# Patient Record
Sex: Female | Born: 1955 | Race: Black or African American | Hispanic: No | Marital: Single | Smoking: Never smoker
Health system: Southern US, Community
[De-identification: ages and names within clinical notes are randomized; demographics above are authoritative.]

## PROBLEM LIST (undated history)

## (undated) DIAGNOSIS — M86172 Other acute osteomyelitis, left ankle and foot: Secondary | ICD-10-CM

## (undated) DIAGNOSIS — E119 Type 2 diabetes mellitus without complications: Secondary | ICD-10-CM

## (undated) DIAGNOSIS — J45909 Unspecified asthma, uncomplicated: Secondary | ICD-10-CM

## (undated) DIAGNOSIS — I10 Essential (primary) hypertension: Secondary | ICD-10-CM

## (undated) DIAGNOSIS — F32A Depression, unspecified: Secondary | ICD-10-CM

## (undated) DIAGNOSIS — R6520 Severe sepsis without septic shock: Secondary | ICD-10-CM

## (undated) DIAGNOSIS — G5600 Carpal tunnel syndrome, unspecified upper limb: Secondary | ICD-10-CM

## (undated) DIAGNOSIS — K219 Gastro-esophageal reflux disease without esophagitis: Secondary | ICD-10-CM

## (undated) DIAGNOSIS — D869 Sarcoidosis, unspecified: Secondary | ICD-10-CM

## (undated) DIAGNOSIS — J9621 Acute and chronic respiratory failure with hypoxia: Secondary | ICD-10-CM

## (undated) DIAGNOSIS — F329 Major depressive disorder, single episode, unspecified: Secondary | ICD-10-CM

## (undated) DIAGNOSIS — F1910 Other psychoactive substance abuse, uncomplicated: Secondary | ICD-10-CM

## (undated) DIAGNOSIS — A419 Sepsis, unspecified organism: Secondary | ICD-10-CM

## (undated) DIAGNOSIS — G5620 Lesion of ulnar nerve, unspecified upper limb: Secondary | ICD-10-CM

## (undated) HISTORY — PX: APPENDECTOMY: SHX54

## (undated) HISTORY — PX: BACK SURGERY: SHX140

## (undated) HISTORY — PX: OTHER SURGICAL HISTORY: SHX169

## (undated) HISTORY — PX: FOOT AMPUTATION THROUGH METATARSAL: SHX644

---

## 2018-05-08 ENCOUNTER — Inpatient Hospital Stay
Admit: 2018-05-08 | Discharge: 2018-05-29 | Disposition: A | Discharge status: Other Acute Inpatient Hospital | Payer: Medicare HMO | Source: Other Acute Inpatient Hospital | Attending: Internal Medicine | Admitting: Internal Medicine

## 2018-05-08 DIAGNOSIS — R52 Pain, unspecified: Secondary | ICD-10-CM

## 2018-05-08 DIAGNOSIS — J9690 Respiratory failure, unspecified, unspecified whether with hypoxia or hypercapnia: Secondary | ICD-10-CM

## 2018-05-08 DIAGNOSIS — E1142 Type 2 diabetes mellitus with diabetic polyneuropathy: Secondary | ICD-10-CM

## 2018-05-08 DIAGNOSIS — M86271 Subacute osteomyelitis, right ankle and foot: Secondary | ICD-10-CM

## 2018-05-09 LAB — BASIC METABOLIC PANEL
Anion gap: 7 (ref 5–15)
BUN: 18 mg/dL (ref 8–23)
CO2: 21 mmol/L — ABNORMAL LOW (ref 22–32)
Calcium: 7.4 mg/dL — ABNORMAL LOW (ref 8.9–10.3)
Chloride: 104 mmol/L (ref 98–111)
Creatinine, Ser: 0.91 mg/dL (ref 0.44–1.00)
GFR calc Af Amer: >60 mL/min (ref >60)
GFR calc non Af Amer: >60 mL/min (ref >60)
Glucose, Bld: 257 mg/dL — ABNORMAL HIGH (ref 70–99)
Potassium: 3.7 mmol/L (ref 3.5–5.1)
Sodium: 132 mmol/L — ABNORMAL LOW (ref 135–145)

## 2018-05-09 LAB — CBC
HCT: 23.6 % — ABNORMAL LOW (ref 36.0–46.0)
Hemoglobin: 7.6 g/dL — ABNORMAL LOW (ref 12.0–15.0)
MCH: 24.6 pg — ABNORMAL LOW (ref 26.0–34.0)
MCHC: 32.2 g/dL (ref 30.0–36.0)
MCV: 76.4 fL — ABNORMAL LOW (ref 80.0–100.0)
Platelets: 282 10*3/uL (ref 150–400)
RBC: 3.09 MIL/uL — ABNORMAL LOW (ref 3.87–5.11)
RDW: 18.2 % — ABNORMAL HIGH (ref 11.5–15.5)
WBC: 13.4 10*3/uL — ABNORMAL HIGH (ref 4.0–10.5)
nRBC: 0 % (ref 0.0–0.2)

## 2018-05-10 ENCOUNTER — Encounter (HOSPITAL_BASED_OUTPATIENT_CLINIC_OR_DEPARTMENT_OTHER): Payer: Medicare HMO

## 2018-05-10 DIAGNOSIS — M7989 Other specified soft tissue disorders: Secondary | ICD-10-CM

## 2018-05-10 NOTE — Progress Notes (Signed)
VASCULAR LAB PRELIMINARY  PRELIMINARY  PRELIMINARY  PRELIMINARY  Left upper extremity venous duplex completed.    Preliminary report:  See CV proc for preliminary results.  Lima Chillemi, RVT 05/10/2018, 6:42 PM

## 2018-05-11 LAB — BASIC METABOLIC PANEL
Anion gap: 8 (ref 5–15)
BUN: 11 mg/dL (ref 8–23)
CO2: 24 mmol/L (ref 22–32)
Calcium: 7.7 mg/dL — ABNORMAL LOW (ref 8.9–10.3)
Chloride: 99 mmol/L (ref 98–111)
Creatinine, Ser: 0.76 mg/dL (ref 0.44–1.00)
GFR calc Af Amer: >60 mL/min (ref >60)
GFR calc non Af Amer: >60 mL/min (ref >60)
Glucose, Bld: 151 mg/dL — ABNORMAL HIGH (ref 70–99)
Potassium: 3.7 mmol/L (ref 3.5–5.1)
Sodium: 131 mmol/L — ABNORMAL LOW (ref 135–145)

## 2018-05-11 LAB — CBC
HCT: 22.9 % — ABNORMAL LOW (ref 36.0–46.0)
Hemoglobin: 7.4 g/dL — ABNORMAL LOW (ref 12.0–15.0)
MCH: 24.7 pg — ABNORMAL LOW (ref 26.0–34.0)
MCHC: 32.3 g/dL (ref 30.0–36.0)
MCV: 76.6 fL — ABNORMAL LOW (ref 80.0–100.0)
Platelets: 343 10*3/uL (ref 150–400)
RBC: 2.99 MIL/uL — ABNORMAL LOW (ref 3.87–5.11)
RDW: 18.4 % — ABNORMAL HIGH (ref 11.5–15.5)
WBC: 14.7 10*3/uL — ABNORMAL HIGH (ref 4.0–10.5)
nRBC: 0 % (ref 0.0–0.2)

## 2018-05-12 LAB — CREATININE, SERUM
Creatinine, Ser: 0.76 mg/dL (ref 0.44–1.00)
GFR calc Af Amer: >60 mL/min (ref >60)
GFR calc non Af Amer: >60 mL/min (ref >60)

## 2018-05-12 LAB — BUN: BUN: 14 mg/dL (ref 8–23)

## 2018-05-12 LAB — CK: Total CK: 18 U/L — ABNORMAL LOW (ref 38–234)

## 2018-05-16 LAB — BLOOD CULTURE ID PANEL (REFLEXED)

## 2018-05-17 LAB — BASIC METABOLIC PANEL
Anion gap: 10 (ref 5–15)
BUN: 20 mg/dL (ref 8–23)
CO2: 27 mmol/L (ref 22–32)
Calcium: 8.3 mg/dL — ABNORMAL LOW (ref 8.9–10.3)
Chloride: 93 mmol/L — ABNORMAL LOW (ref 98–111)
Creatinine, Ser: 0.78 mg/dL (ref 0.44–1.00)
GFR calc Af Amer: >60 mL/min (ref >60)
GFR calc non Af Amer: >60 mL/min (ref >60)
Glucose, Bld: 195 mg/dL — ABNORMAL HIGH (ref 70–99)
Potassium: 4.1 mmol/L (ref 3.5–5.1)
Sodium: 130 mmol/L — ABNORMAL LOW (ref 135–145)

## 2018-05-18 LAB — CULTURE, BLOOD (ROUTINE X 2)
Special Requests: ADEQUATE
Special Requests: ADEQUATE

## 2018-05-19 LAB — CULTURE, BLOOD (ROUTINE X 2)
Special Requests: ADEQUATE
Special Requests: ADEQUATE

## 2018-05-20 ENCOUNTER — Other Ambulatory Visit (HOSPITAL_COMMUNITY): Payer: Medicare HMO

## 2018-05-20 LAB — BASIC METABOLIC PANEL
Anion gap: 8 (ref 5–15)
Anion gap: 8 (ref 5–15)
BUN: 16 mg/dL (ref 8–23)
BUN: 19 mg/dL (ref 8–23)
CO2: 29 mmol/L (ref 22–32)
CO2: 28 mmol/L (ref 22–32)
Calcium: 8.2 mg/dL — ABNORMAL LOW (ref 8.9–10.3)
Calcium: 8.3 mg/dL — ABNORMAL LOW (ref 8.9–10.3)
Chloride: 97 mmol/L — ABNORMAL LOW (ref 98–111)
Chloride: 97 mmol/L — ABNORMAL LOW (ref 98–111)
Creatinine, Ser: 0.76 mg/dL (ref 0.44–1.00)
Creatinine, Ser: 0.8 mg/dL (ref 0.44–1.00)
GFR calc Af Amer: >60 mL/min (ref >60)
GFR calc Af Amer: >60 mL/min (ref >60)
GFR calc non Af Amer: >60 mL/min (ref >60)
GFR calc non Af Amer: >60 mL/min (ref >60)
Glucose, Bld: 112 mg/dL — ABNORMAL HIGH (ref 70–99)
Glucose, Bld: 80 mg/dL (ref 70–99)
Potassium: 3.5 mmol/L (ref 3.5–5.1)
Potassium: 3.6 mmol/L (ref 3.5–5.1)
Sodium: 134 mmol/L — ABNORMAL LOW (ref 135–145)
Sodium: 133 mmol/L — ABNORMAL LOW (ref 135–145)

## 2018-05-20 LAB — CBC WITH DIFFERENTIAL/PLATELET
Abs Immature Granulocytes: 0 10*3/uL (ref 0.00–0.07)
Basophils Absolute: 0 10*3/uL (ref 0.0–0.1)
Basophils Relative: 0 %
Eosinophils Absolute: 0 10*3/uL (ref 0.0–0.5)
Eosinophils Relative: 0 %
HCT: 18.3 % — ABNORMAL LOW (ref 36.0–46.0)
Hemoglobin: 5.8 g/dL — CL (ref 12.0–15.0)
Lymphocytes Relative: 23 %
Lymphs Abs: 2.2 10*3/uL (ref 0.7–4.0)
MCH: 24.8 pg — ABNORMAL LOW (ref 26.0–34.0)
MCHC: 31.7 g/dL (ref 30.0–36.0)
MCV: 78.2 fL — ABNORMAL LOW (ref 80.0–100.0)
Monocytes Absolute: 0.4 10*3/uL (ref 0.1–1.0)
Monocytes Relative: 4 %
Neutro Abs: 7 10*3/uL (ref 1.7–7.7)
Neutrophils Relative %: 73 %
Platelets: 298 10*3/uL (ref 150–400)
RBC: 2.34 MIL/uL — ABNORMAL LOW (ref 3.87–5.11)
RDW: 18.3 % — ABNORMAL HIGH (ref 11.5–15.5)
WBC: 9.6 10*3/uL (ref 4.0–10.5)
nRBC: 0 % (ref 0.0–0.2)
nRBC: 1 /100 WBC — ABNORMAL HIGH

## 2018-05-20 LAB — CBC
HCT: 20.6 % — ABNORMAL LOW (ref 36.0–46.0)
HCT: 22 % — ABNORMAL LOW (ref 36.0–46.0)
Hemoglobin: 6.5 g/dL — CL (ref 12.0–15.0)
Hemoglobin: 7.1 g/dL — ABNORMAL LOW (ref 12.0–15.0)
MCH: 25.5 pg — ABNORMAL LOW (ref 26.0–34.0)
MCH: 26 pg (ref 26.0–34.0)
MCHC: 31.6 g/dL (ref 30.0–36.0)
MCHC: 32.3 g/dL (ref 30.0–36.0)
MCV: 80.8 fL (ref 80.0–100.0)
MCV: 80.6 fL (ref 80.0–100.0)
Platelets: 284 10*3/uL (ref 150–400)
Platelets: 297 10*3/uL (ref 150–400)
RBC: 2.55 MIL/uL — ABNORMAL LOW (ref 3.87–5.11)
RBC: 2.73 MIL/uL — ABNORMAL LOW (ref 3.87–5.11)
RDW: 19.6 % — ABNORMAL HIGH (ref 11.5–15.5)
RDW: 18.9 % — ABNORMAL HIGH (ref 11.5–15.5)
WBC: 10.4 10*3/uL (ref 4.0–10.5)
WBC: 12.3 10*3/uL — ABNORMAL HIGH (ref 4.0–10.5)
nRBC: 0 % (ref 0.0–0.2)
nRBC: 0 % (ref 0.0–0.2)

## 2018-05-20 LAB — PREPARE RBC (CROSSMATCH)

## 2018-05-20 LAB — ABO/RH: ABO/RH(D): O POS

## 2018-05-21 LAB — CBC
HCT: 21.3 % — ABNORMAL LOW (ref 36.0–46.0)
HCT: 23.8 % — ABNORMAL LOW (ref 36.0–46.0)
Hemoglobin: 6.8 g/dL — CL (ref 12.0–15.0)
Hemoglobin: 7.6 g/dL — ABNORMAL LOW (ref 12.0–15.0)
MCH: 25.9 pg — ABNORMAL LOW (ref 26.0–34.0)
MCH: 25.8 pg — ABNORMAL LOW (ref 26.0–34.0)
MCHC: 31.9 g/dL (ref 30.0–36.0)
MCHC: 31.9 g/dL (ref 30.0–36.0)
MCV: 81 fL (ref 80.0–100.0)
MCV: 80.7 fL (ref 80.0–100.0)
Platelets: 300 10*3/uL (ref 150–400)
Platelets: 318 10*3/uL (ref 150–400)
RBC: 2.63 MIL/uL — ABNORMAL LOW (ref 3.87–5.11)
RBC: 2.95 MIL/uL — ABNORMAL LOW (ref 3.87–5.11)
RDW: 18.6 % — ABNORMAL HIGH (ref 11.5–15.5)
RDW: 18.6 % — ABNORMAL HIGH (ref 11.5–15.5)
WBC: 10.9 10*3/uL — ABNORMAL HIGH (ref 4.0–10.5)
WBC: 12.4 10*3/uL — ABNORMAL HIGH (ref 4.0–10.5)
nRBC: 0 % (ref 0.0–0.2)
nRBC: 0 % (ref 0.0–0.2)

## 2018-05-21 LAB — OCCULT BLOOD X 1 CARD TO LAB, STOOL: Fecal Occult Bld: POSITIVE — AB

## 2018-05-21 LAB — PREPARE RBC (CROSSMATCH)

## 2018-05-22 LAB — BPAM RBC
Blood Product Expiration Date: 202005042359
Blood Product Expiration Date: 202005052359
ISSUE DATE / TIME: 202004291614
ISSUE DATE / TIME: 202004301315
Unit Type and Rh: 5100
Unit Type and Rh: 5100

## 2018-05-22 LAB — CBC
HCT: 22.4 % — ABNORMAL LOW (ref 36.0–46.0)
Hemoglobin: 7.4 g/dL — ABNORMAL LOW (ref 12.0–15.0)
MCH: 26.6 pg (ref 26.0–34.0)
MCHC: 33 g/dL (ref 30.0–36.0)
MCV: 80.6 fL (ref 80.0–100.0)
Platelets: 274 10*3/uL (ref 150–400)
RBC: 2.78 MIL/uL — ABNORMAL LOW (ref 3.87–5.11)
RDW: 17.9 % — ABNORMAL HIGH (ref 11.5–15.5)
WBC: 11.6 10*3/uL — ABNORMAL HIGH (ref 4.0–10.5)
nRBC: 0 % (ref 0.0–0.2)

## 2018-05-22 LAB — TYPE AND SCREEN
ABO/RH(D): O POS
Antibody Screen: NEGATIVE
Unit division: 0
Unit division: 0

## 2018-05-23 LAB — CULTURE, BLOOD (ROUTINE X 2)
Special Requests: ADEQUATE
Special Requests: ADEQUATE

## 2018-05-24 LAB — URINALYSIS, MICROSCOPIC (REFLEX)
Bacteria, UA: NONE SEEN
RBC / HPF: NONE SEEN RBC/hpf (ref 0–5)
Squamous Epithelial / HPF: NONE SEEN (ref 0–5)

## 2018-05-24 LAB — URINALYSIS, ROUTINE W REFLEX MICROSCOPIC
Glucose, UA: NEGATIVE mg/dL
Ketones, ur: NEGATIVE mg/dL
Nitrite: NEGATIVE
Protein, ur: 30 mg/dL — AB
Specific Gravity, Urine: 1.01 (ref 1.005–1.030)
pH: 6.5 (ref 5.0–8.0)

## 2018-05-24 LAB — VANCOMYCIN, TROUGH: Vancomycin Tr: 14 ug/mL — ABNORMAL LOW (ref 15–20)

## 2018-05-25 ENCOUNTER — Other Ambulatory Visit (HOSPITAL_COMMUNITY): Payer: Medicare HMO

## 2018-05-25 LAB — BASIC METABOLIC PANEL
Anion gap: 6 (ref 5–15)
BUN: 11 mg/dL (ref 8–23)
CO2: 27 mmol/L (ref 22–32)
Calcium: 8 mg/dL — ABNORMAL LOW (ref 8.9–10.3)
Chloride: 100 mmol/L (ref 98–111)
Creatinine, Ser: 0.7 mg/dL (ref 0.44–1.00)
GFR calc Af Amer: >60 mL/min (ref >60)
GFR calc non Af Amer: >60 mL/min (ref >60)
Glucose, Bld: 107 mg/dL — ABNORMAL HIGH (ref 70–99)
Potassium: 3.2 mmol/L — ABNORMAL LOW (ref 3.5–5.1)
Sodium: 133 mmol/L — ABNORMAL LOW (ref 135–145)

## 2018-05-25 LAB — CBC
HCT: 24.8 % — ABNORMAL LOW (ref 36.0–46.0)
Hemoglobin: 7.9 g/dL — ABNORMAL LOW (ref 12.0–15.0)
MCH: 25.8 pg — ABNORMAL LOW (ref 26.0–34.0)
MCHC: 31.9 g/dL (ref 30.0–36.0)
MCV: 81 fL (ref 80.0–100.0)
Platelets: 372 10*3/uL (ref 150–400)
RBC: 3.06 MIL/uL — ABNORMAL LOW (ref 3.87–5.11)
RDW: 18.6 % — ABNORMAL HIGH (ref 11.5–15.5)
WBC: 11.6 10*3/uL — ABNORMAL HIGH (ref 4.0–10.5)
nRBC: 0 % (ref 0.0–0.2)

## 2018-05-25 LAB — URINE CULTURE: Culture: NO GROWTH

## 2018-05-25 NOTE — Progress Notes (Signed)
  Echocardiogram 2D Echocardiogram has been performed.  Delcie Roch 05/25/2018, 9:03 AM

## 2018-05-26 ENCOUNTER — Other Ambulatory Visit (HOSPITAL_COMMUNITY): Payer: Medicare HMO

## 2018-05-26 LAB — VANCOMYCIN, TROUGH: Vancomycin Tr: 23 ug/mL (ref 15–20)

## 2018-05-26 LAB — POTASSIUM: Potassium: 3.5 mmol/L (ref 3.5–5.1)

## 2018-05-26 MED ORDER — IOHEXOL 300 MG/ML  SOLN
100.0000 mL | Freq: Once | INTRAMUSCULAR | Status: AC | PRN
  Administered 2018-05-26: 17:00:00 100 mL via INTRAVENOUS

## 2018-05-27 DIAGNOSIS — E1142 Type 2 diabetes mellitus with diabetic polyneuropathy: Secondary | ICD-10-CM

## 2018-05-27 DIAGNOSIS — M86271 Subacute osteomyelitis, right ankle and foot: Secondary | ICD-10-CM

## 2018-05-27 LAB — VANCOMYCIN, TROUGH: Vancomycin Tr: 9 ug/mL — ABNORMAL LOW (ref 15–20)

## 2018-05-27 NOTE — H&P (View-Only) (Signed)
ORTHOPAEDIC CONSULTATION  REQUESTING PHYSICIAN: Carron CurieHijazi, Ali, MD  Chief Complaint: Osteomyelitis and cellulitis right transmetatarsal amputation.  HPI: Rachel Proctor is a 63 y.o. female who presents with multiple medical problems including bilateral transmetatarsal amputations who has been having problems with ulceration drainage cellulitis and osteomyelitis of her right transmetatarsal amputation.  No past medical history on file.     Past medical history is significant for hypothyroidism protein caloric malnutrition renal insufficiency metabolic encephalopathy status post bilateral transmetatarsal amputations with history of polysubstance abuse and diabetes. Social History   Socioeconomic History   Marital status: Single    Spouse name: Not on file   Number of children: Not on file   Years of education: Not on file   Highest education level: Not on file  Occupational History   Not on file  Social Needs   Financial resource strain: Not on file   Food insecurity:    Worry: Not on file    Inability: Not on file   Transportation needs:    Medical: Not on file    Non-medical: Not on file  Tobacco Use   Smoking status: Not on file  Substance and Sexual Activity   Alcohol use: Not on file   Drug use: Not on file   Sexual activity: Not on file  Lifestyle   Physical activity:    Days per week: Not on file    Minutes per session: Not on file   Stress: Not on file  Relationships   Social connections:    Talks on phone: Not on file    Gets together: Not on file    Attends religious service: Not on file    Active member of club or organization: Not on file    Attends meetings of clubs or organizations: Not on file    Relationship status: Not on file  Other Topics Concern   Not on file  Social History Narrative   Not on file   No family history on file. - negative except otherwise stated in the family history section Allergies not on file Prior to  Admission medications   Not on File   Ct Chest W Contrast  Result Date: 05/26/2018 CLINICAL DATA:  Actinomycosis.  Abscess. EXAM: CT CHEST WITH CONTRAST TECHNIQUE: Multidetector CT imaging of the chest was performed during intravenous contrast administration. CONTRAST:  100mL OMNIPAQUE IOHEXOL 300 MG/ML  SOLN COMPARISON:  None. FINDINGS: Cardiovascular: Atherosclerotic atherosclerotic calcification of the aortic arch and branch vessels. Mild cardiomegaly. Small pericardial effusion. Mediastinum/Nodes: Right lower paratracheal node anterior to the carina measures 1.0 cm in short axis on image 45/3. Indistinct right hilar node approximately 1.0 cm in short axis, image 54/3. Lungs/Pleura: Small bilateral pleural effusions with passive atelectasis. These effusions are nonspecific for exudative or transudative etiology. Bilateral scattered pulmonary nodules of variable size, some with internal cavitation, for the most part peripherally distributed in the lungs. A right upper lobe nodule with mild internal cavitation measures 1.6 by 1.5 cm on image 49/4. An index left upper lobe nodule also with a small amount of internal cavitation measures 1.2 by 1.3 cm on image 54/4. No findings of chest wall involvement/empyema necessitans. Upper Abdomen: Unremarkable Musculoskeletal: Old healed fracture of the left posterolateral fifth, sixth, seventh, and eighth ribs. Minimal thoracic spondylosis. IMPRESSION: 1. Pleural effusion with scattered bilateral pulmonary nodules some of which are mildly cavitary. This appearance, while nonspecific, would certainly be very reasonably consistent with active pulmonary actinomycosis. Other entities which can cause cavitating lung  nodules include tuberculosis, cavitating metastatic disease such as squamous cell carcinoma, noninfectious granuloma such as rheumatoid nodules, pulmonary infarcts, and other atypical infectious processes such as coccidiomycosis, nocardiosis, or cryptococcosis.  There are no findings of empyema necessitans. 2. Borderline enlarged right lower paratracheal and right hilar lymph nodes. 3. Aortic Atherosclerosis (ICD10-I70.0). Mild cardiomegaly with small pericardial effusion. Electronically Signed   By: Walter  Liebkemann M.D.   On: 05/26/2018 17:16  ° °Ct Foot Right W Contrast ° °Result Date: 05/26/2018 °CLINICAL DATA:  Osteomyelitis.  Actinomycosis. EXAM: CT OF THE LOWER RIGHT EXTREMITY WITH CONTRAST TECHNIQUE: Multidetector CT imaging of the lower right extremity was performed according to the standard protocol following intravenous contrast administration. COMPARISON:  None. CONTRAST:  100mL OMNIPAQUE IOHEXOL 300 MG/ML  SOLN FINDINGS: Bones/Joint/Cartilage There has been amputation of the toes at the mid to distal metatarsal level with a bridging bony bar of callus between the distal metatarsals as shown for example on images 13 through 36 of series 6. On images 34 through 43 of series 8, the soft tissues overlying this bony bar particularly in the vicinity of the second metatarsal are prominently thinned and there is lucency in the bony bar dorsally suspicious for erosion and potentially representing active osteomyelitis although not entirely specific. Plantar calcaneal spur noted. Moderate bony demineralization. Dorsal spurring of the navicular. There is some mild spurring dorsally of the distal medial cuneiform. Articular space narrowing between the cuneiforms noted. Ligaments Suboptimally assessed by CT. Muscles and Tendons Notable fusiform thickening of the Achilles tendon up to 1.6 cm anterior-posterior. The epicenter of this thickening is about 4.5 cm proximal to the insertion site. Possible thickening of the tibialis posterior tendon distally. Thickened medial band of the plantar fascia proximally, with edema signal extending from the medial band of the plantar fascia towards the plantar cutaneous surface for example on image 26/8. Severe regional muscular atrophy.  Soft tissues Scattered subcutaneous edema is present along the heel, medial ankle, and plantar to the distal margin of the metatarsals. No gas tracking in the soft tissues identified. No obvious subcutaneous abscess. IMPRESSION: 1. Prior amputation of the toes at the level of the mid to distal metatarsals with a transverse bony intermetatarsal bar in this vicinity. Soft tissues overlying the second metatarsal portion of this bony bar are extremely thin, and there is some lucency in the bone at this location concerning for the possibility of active osteomyelitis. Correlate with appearance of the skin in this area. 2. Prominent fusiform thickening of the Achilles tendon compatible with tendinopathy. 3. Plantar fasciitis. 4. Subcutaneous edema plantar to the intermetatarsal bony bar. 5. Marked muscular atrophy. 6. Possible tendinopathy of the distal tibialis posterior tendon, which appears somewhat thickened. Electronically Signed   By: Walter  Liebkemann M.D.   On: 05/26/2018 17:34  ° °- pertinent xrays, CT, MRI studies were reviewed and independently interpreted ° °Positive ROS: All other systems have been reviewed and were otherwise negative with the exception of those mentioned in the HPI and as above. ° °Physical Exam: °General: Alert, no acute distress °Psychiatric: Patient is competent for consent with normal mood and affect °Lymphatic: No axillary or cervical lymphadenopathy °Cardiovascular: No pedal edema °Respiratory: No cyanosis, no use of accessory musculature °GI: No organomegaly, abdomen is soft and non-tender ° ° ° °Images: ° °@ENCIMAGES@ ° °Labs: ° °Lab Results  °Component Value Date  ° REPTSTATUS PENDING 05/24/2018  ° CULT  05/24/2018  °  NO GROWTH 3 DAYS °Performed at [facility], [address] [place], [place] [address]    LABORGA METHICILLIN RESISTANT STAPHYLOCOCCUS AUREUS 05/20/2018    No results found for: ALBUMIN, PREALBUMIN, LABURIC  Neurologic: Patient does not have protective  sensation bilateral lower extremities.   MUSCULOSKELETAL:   Skin: Examination patient has multiple ulcers on the right transmetatarsal amputation.  She does have a palpable pulse her foot is warm.  Review of the CT scan shows bony bridge across the metatarsals with periosteal elevation at the second metatarsal consistent with osteomyelitis.  Examination the left foot she has no ulcers no cellulitis she has good soft tissue good warmth.  Assessment: Assessment: Diabetic insensate neuropathy with osteomyelitis ulceration and cellulitis of her right transmetatarsal amputation.  Plan: Plan: We will plan for BKA surgery on Friday.  Discussed with the patient that a revision of the transmetatarsal amputation could possibly heal but I doubt this would give her sufficient surface area for safe ambulation.  She underwent irrigation and debridement of the foot about 3 weeks ago and still has an infection.  I have recommended proceeding with a transtibial amputation to both the patient and her Rachel Proctor who is the power of attorney.  They both agree that they would like to proceed with the transtibial amputation.  We will plan for surgery on Friday [facility]  Thank you for the consult and the opportunity to see Ms. Grace Isaac, MD [facility] [phone] 3:10 PM

## 2018-05-27 NOTE — Consult Note (Signed)
ORTHOPAEDIC CONSULTATION  REQUESTING PHYSICIAN: Carron CurieHijazi, Ali, MD  Chief Complaint: Osteomyelitis and cellulitis right transmetatarsal amputation.  HPI: Rachel BlueJanice Proctor is a 63 y.o. female who presents with multiple medical problems including bilateral transmetatarsal amputations who has been having problems with ulceration drainage cellulitis and osteomyelitis of her right transmetatarsal amputation.  No past medical history on file.     Past medical history is significant for hypothyroidism protein caloric malnutrition renal insufficiency metabolic encephalopathy status post bilateral transmetatarsal amputations with history of polysubstance abuse and diabetes. Social History   Socioeconomic History   Marital status: Single    Spouse name: Not on file   Number of children: Not on file   Years of education: Not on file   Highest education level: Not on file  Occupational History   Not on file  Social Needs   Financial resource strain: Not on file   Food insecurity:    Worry: Not on file    Inability: Not on file   Transportation needs:    Medical: Not on file    Non-medical: Not on file  Tobacco Use   Smoking status: Not on file  Substance and Sexual Activity   Alcohol use: Not on file   Drug use: Not on file   Sexual activity: Not on file  Lifestyle   Physical activity:    Days per week: Not on file    Minutes per session: Not on file   Stress: Not on file  Relationships   Social connections:    Talks on phone: Not on file    Gets together: Not on file    Attends religious service: Not on file    Active member of club or organization: Not on file    Attends meetings of clubs or organizations: Not on file    Relationship status: Not on file  Other Topics Concern   Not on file  Social History Narrative   Not on file   No family history on file. - negative except otherwise stated in the family history section Allergies not on file Prior to  Admission medications   Not on File   Ct Chest W Contrast  Result Date: 05/26/2018 CLINICAL DATA:  Actinomycosis.  Abscess. EXAM: CT CHEST WITH CONTRAST TECHNIQUE: Multidetector CT imaging of the chest was performed during intravenous contrast administration. CONTRAST:  100mL OMNIPAQUE IOHEXOL 300 MG/ML  SOLN COMPARISON:  None. FINDINGS: Cardiovascular: Atherosclerotic atherosclerotic calcification of the aortic arch and branch vessels. Mild cardiomegaly. Small pericardial effusion. Mediastinum/Nodes: Right lower paratracheal node anterior to the carina measures 1.0 cm in short axis on image 45/3. Indistinct right hilar node approximately 1.0 cm in short axis, image 54/3. Lungs/Pleura: Small bilateral pleural effusions with passive atelectasis. These effusions are nonspecific for exudative or transudative etiology. Bilateral scattered pulmonary nodules of variable size, some with internal cavitation, for the most part peripherally distributed in the lungs. A right upper lobe nodule with mild internal cavitation measures 1.6 by 1.5 cm on image 49/4. An index left upper lobe nodule also with a small amount of internal cavitation measures 1.2 by 1.3 cm on image 54/4. No findings of chest wall involvement/empyema necessitans. Upper Abdomen: Unremarkable Musculoskeletal: Old healed fracture of the left posterolateral fifth, sixth, seventh, and eighth ribs. Minimal thoracic spondylosis. IMPRESSION: 1. Pleural effusion with scattered bilateral pulmonary nodules some of which are mildly cavitary. This appearance, while nonspecific, would certainly be very reasonably consistent with active pulmonary actinomycosis. Other entities which can cause cavitating lung  nodules include tuberculosis, cavitating metastatic disease such as squamous cell carcinoma, noninfectious granuloma such as rheumatoid nodules, pulmonary infarcts, and other atypical infectious processes such as coccidiomycosis, nocardiosis, or cryptococcosis.  There are no findings of empyema necessitans. 2. Borderline enlarged right lower paratracheal and right hilar lymph nodes. 3. Aortic Atherosclerosis (ICD10-I70.0). Mild cardiomegaly with small pericardial effusion. Electronically Signed   By: Gaylyn Rong M.D.   On: 05/26/2018 17:16   Ct Foot Right W Contrast  Result Date: 05/26/2018 CLINICAL DATA:  Osteomyelitis.  Actinomycosis. EXAM: CT OF THE LOWER RIGHT EXTREMITY WITH CONTRAST TECHNIQUE: Multidetector CT imaging of the lower right extremity was performed according to the standard protocol following intravenous contrast administration. COMPARISON:  None. CONTRAST:  OMNIPAQUE IOHEXOL 300 MG/ML  SOLN FINDINGS: Bones/Joint/Cartilage There has been amputation of the toes at the mid to distal metatarsal level with a bridging bony bar of callus between the distal metatarsals as shown for example on images 13 through 36 of series 6. On images 34 through 43 of series 8, the soft tissues overlying this bony bar particularly in the vicinity of the second metatarsal are prominently thinned and there is lucency in the bony bar dorsally suspicious for erosion and potentially representing active osteomyelitis although not entirely specific. Plantar calcaneal spur noted. Moderate bony demineralization. Dorsal spurring of the navicular. There is some mild spurring dorsally of the distal medial cuneiform. Articular space narrowing between the cuneiforms noted. Ligaments Suboptimally assessed by CT. Muscles and Tendons Notable fusiform thickening of the Achilles tendon up to 1.6 cm anterior-posterior. The epicenter of this thickening is about 4.5 cm proximal to the insertion site. Possible thickening of the tibialis posterior tendon distally. Thickened medial band of the plantar fascia proximally, with edema signal extending from the medial band of the plantar fascia towards the plantar cutaneous surface for example on image 26/8. Severe regional muscular atrophy.  Soft tissues Scattered subcutaneous edema is present along the heel, medial ankle, and plantar to the distal margin of the metatarsals. No gas tracking in the soft tissues identified. No obvious subcutaneous abscess. IMPRESSION: 1. Prior amputation of the toes at the level of the mid to distal metatarsals with a transverse bony intermetatarsal bar in this vicinity. Soft tissues overlying the second metatarsal portion of this bony bar are extremely thin, and there is some lucency in the bone at this location concerning for the possibility of active osteomyelitis. Correlate with appearance of the skin in this area. 2. Prominent fusiform thickening of the Achilles tendon compatible with tendinopathy. 3. Plantar fasciitis. 4. Subcutaneous edema plantar to the intermetatarsal bony bar. 5. Marked muscular atrophy. 6. Possible tendinopathy of the distal tibialis posterior tendon, which appears somewhat thickened. Electronically Signed   By: Gaylyn Rong M.D.   On: 05/26/2018 17:34   - pertinent xrays, CT, MRI studies were reviewed and independently interpreted  Positive ROS: All other systems have been reviewed and were otherwise negative with the exception of those mentioned in the HPI and as above.  Physical Exam: General: Alert, no acute distress Psychiatric: Patient is competent for consent with normal mood and affect Lymphatic: No axillary or cervical lymphadenopathy Cardiovascular: No pedal edema Respiratory: No cyanosis, no use of accessory musculature GI: No organomegaly, abdomen is soft and non-tender    Images:  @  Labs:  Lab Results  Component Value Date   REPTSTATUS PENDING 05/24/2018   CULT  05/24/2018    NO GROWTH 3 DAYS Performed at [facility], [address] [place], [place] [address]    LABORGA METHICILLIN RESISTANT STAPHYLOCOCCUS AUREUS 05/20/2018    No results found for: ALBUMIN, PREALBUMIN, LABURIC  Neurologic: Patient does not have protective  sensation bilateral lower extremities.   MUSCULOSKELETAL:   Skin: Examination patient has multiple ulcers on the right transmetatarsal amputation.  She does have a palpable pulse her foot is warm.  Review of the CT scan shows bony bridge across the metatarsals with periosteal elevation at the second metatarsal consistent with osteomyelitis.  Examination the left foot she has no ulcers no cellulitis she has good soft tissue good warmth.  Assessment: Assessment: Diabetic insensate neuropathy with osteomyelitis ulceration and cellulitis of her right transmetatarsal amputation.  Plan: Plan: We will plan for BKA surgery on Friday.  Discussed with the patient that a revision of the transmetatarsal amputation could possibly heal but I doubt this would give her sufficient surface area for safe ambulation.  She underwent irrigation and debridement of the foot about 3 weeks ago and still has an infection.  I have recommended proceeding with a transtibial amputation to both the patient and her Sister Rachel Proctor who is the power of attorney.  They both agree that they would like to proceed with the transtibial amputation.  We will plan for surgery on [facility]  Thank you for the consult and the opportunity to see Ms. Grace Isaac, MD [facility] [phone] 3:10 PM

## 2018-05-28 ENCOUNTER — Ambulatory Visit (INDEPENDENT_AMBULATORY_CARE_PROVIDER_SITE_OTHER): Payer: Self-pay | Admitting: Physician Assistant

## 2018-05-28 LAB — CBC
HCT: 24.6 % — ABNORMAL LOW (ref 36.0–46.0)
Hemoglobin: 7.7 g/dL — ABNORMAL LOW (ref 12.0–15.0)
MCH: 25.8 pg — ABNORMAL LOW (ref 26.0–34.0)
MCHC: 31.3 g/dL (ref 30.0–36.0)
MCV: 82.3 fL (ref 80.0–100.0)
Platelets: 480 10*3/uL — ABNORMAL HIGH (ref 150–400)
RBC: 2.99 MIL/uL — ABNORMAL LOW (ref 3.87–5.11)
RDW: 18.8 % — ABNORMAL HIGH (ref 11.5–15.5)
WBC: 10.8 10*3/uL — ABNORMAL HIGH (ref 4.0–10.5)
nRBC: 0.2 % (ref 0.0–0.2)

## 2018-05-28 LAB — BASIC METABOLIC PANEL
Anion gap: 10 (ref 5–15)
BUN: 14 mg/dL (ref 8–23)
CO2: 28 mmol/L (ref 22–32)
Calcium: 8 mg/dL — ABNORMAL LOW (ref 8.9–10.3)
Chloride: 98 mmol/L (ref 98–111)
Creatinine, Ser: 0.97 mg/dL (ref 0.44–1.00)
GFR calc Af Amer: >60 mL/min (ref >60)
GFR calc non Af Amer: >60 mL/min (ref >60)
Glucose, Bld: 85 mg/dL (ref 70–99)
Potassium: 3.1 mmol/L — ABNORMAL LOW (ref 3.5–5.1)
Sodium: 136 mmol/L (ref 135–145)

## 2018-05-28 LAB — CULTURE, BLOOD (ROUTINE X 2): Special Requests: ADEQUATE

## 2018-05-29 ENCOUNTER — Ambulatory Visit (HOSPITAL_COMMUNITY)
Admission: RE | Admit: 2018-05-29 | Discharge: 2018-05-29 | Disposition: A | Discharge status: Other Acute Inpatient Hospital | Payer: Medicare Other | Source: Other Acute Inpatient Hospital | Attending: Orthopedic Surgery | Admitting: Orthopedic Surgery

## 2018-05-29 ENCOUNTER — Encounter (HOSPITAL_COMMUNITY): Payer: Self-pay

## 2018-05-29 ENCOUNTER — Inpatient Hospital Stay (HOSPITAL_COMMUNITY): Payer: Medicare Other | Admitting: Registered Nurse

## 2018-05-29 ENCOUNTER — Encounter: Payer: Self-pay | Admitting: Registered Nurse

## 2018-05-29 ENCOUNTER — Inpatient Hospital Stay (HOSPITAL_COMMUNITY): Payer: Medicare Other | Admitting: Anesthesiology

## 2018-05-29 ENCOUNTER — Encounter (HOSPITAL_COMMUNITY)
Admission: RE | Disposition: A | Discharge status: Nursing Home (Includes ICF) | Payer: Self-pay | Attending: Orthopedic Surgery

## 2018-05-29 ENCOUNTER — Inpatient Hospital Stay
Admission: AD | Admit: 2018-05-29 | Discharge: 2018-06-18 | Payer: Medicare Other | Source: Other Acute Inpatient Hospital | Attending: Internal Medicine | Admitting: Internal Medicine

## 2018-05-29 ENCOUNTER — Encounter
Disposition: A | Discharge status: Other Acute Inpatient Hospital | Payer: Self-pay | Source: Other Acute Inpatient Hospital | Attending: Internal Medicine

## 2018-05-29 DIAGNOSIS — Z79899 Other long term (current) drug therapy: Secondary | ICD-10-CM | POA: Insufficient documentation

## 2018-05-29 DIAGNOSIS — E039 Hypothyroidism, unspecified: Secondary | ICD-10-CM | POA: Diagnosis not present

## 2018-05-29 DIAGNOSIS — G709 Myoneural disorder, unspecified: Secondary | ICD-10-CM | POA: Insufficient documentation

## 2018-05-29 DIAGNOSIS — T8743 Infection of amputation stump, right lower extremity: Secondary | ICD-10-CM | POA: Insufficient documentation

## 2018-05-29 DIAGNOSIS — I10 Essential (primary) hypertension: Secondary | ICD-10-CM | POA: Insufficient documentation

## 2018-05-29 DIAGNOSIS — K219 Gastro-esophageal reflux disease without esophagitis: Secondary | ICD-10-CM | POA: Insufficient documentation

## 2018-05-29 DIAGNOSIS — M86271 Subacute osteomyelitis, right ankle and foot: Secondary | ICD-10-CM | POA: Diagnosis not present

## 2018-05-29 DIAGNOSIS — Z794 Long term (current) use of insulin: Secondary | ICD-10-CM | POA: Diagnosis not present

## 2018-05-29 DIAGNOSIS — E1151 Type 2 diabetes mellitus with diabetic peripheral angiopathy without gangrene: Secondary | ICD-10-CM | POA: Insufficient documentation

## 2018-05-29 DIAGNOSIS — R918 Other nonspecific abnormal finding of lung field: Secondary | ICD-10-CM

## 2018-05-29 DIAGNOSIS — D649 Anemia, unspecified: Secondary | ICD-10-CM | POA: Diagnosis not present

## 2018-05-29 DIAGNOSIS — F329 Major depressive disorder, single episode, unspecified: Secondary | ICD-10-CM | POA: Diagnosis not present

## 2018-05-29 DIAGNOSIS — L97519 Non-pressure chronic ulcer of other part of right foot with unspecified severity: Secondary | ICD-10-CM | POA: Diagnosis not present

## 2018-05-29 DIAGNOSIS — D759 Disease of blood and blood-forming organs, unspecified: Secondary | ICD-10-CM | POA: Diagnosis not present

## 2018-05-29 DIAGNOSIS — Y835 Amputation of limb(s) as the cause of abnormal reaction of the patient, or of later complication, without mention of misadventure at the time of the procedure: Secondary | ICD-10-CM | POA: Insufficient documentation

## 2018-05-29 DIAGNOSIS — R509 Fever, unspecified: Secondary | ICD-10-CM

## 2018-05-29 DIAGNOSIS — J9621 Acute and chronic respiratory failure with hypoxia: Secondary | ICD-10-CM | POA: Diagnosis present

## 2018-05-29 DIAGNOSIS — I70239 Atherosclerosis of native arteries of right leg with ulceration of unspecified site: Secondary | ICD-10-CM | POA: Diagnosis not present

## 2018-05-29 DIAGNOSIS — M868X7 Other osteomyelitis, ankle and foot: Secondary | ICD-10-CM | POA: Insufficient documentation

## 2018-05-29 DIAGNOSIS — E1140 Type 2 diabetes mellitus with diabetic neuropathy, unspecified: Secondary | ICD-10-CM | POA: Diagnosis not present

## 2018-05-29 DIAGNOSIS — D869 Sarcoidosis, unspecified: Secondary | ICD-10-CM | POA: Diagnosis present

## 2018-05-29 DIAGNOSIS — J45909 Unspecified asthma, uncomplicated: Secondary | ICD-10-CM | POA: Insufficient documentation

## 2018-05-29 DIAGNOSIS — J189 Pneumonia, unspecified organism: Secondary | ICD-10-CM

## 2018-05-29 DIAGNOSIS — A419 Sepsis, unspecified organism: Secondary | ICD-10-CM | POA: Diagnosis present

## 2018-05-29 DIAGNOSIS — F1910 Other psychoactive substance abuse, uncomplicated: Secondary | ICD-10-CM | POA: Diagnosis present

## 2018-05-29 DIAGNOSIS — E1169 Type 2 diabetes mellitus with other specified complication: Secondary | ICD-10-CM | POA: Insufficient documentation

## 2018-05-29 DIAGNOSIS — E11621 Type 2 diabetes mellitus with foot ulcer: Secondary | ICD-10-CM | POA: Diagnosis not present

## 2018-05-29 HISTORY — DX: Sepsis, unspecified organism: R65.20

## 2018-05-29 HISTORY — DX: Depression, unspecified: F32.A

## 2018-05-29 HISTORY — DX: Other psychoactive substance abuse, uncomplicated: F19.10

## 2018-05-29 HISTORY — DX: Carpal tunnel syndrome, unspecified upper limb: G56.00

## 2018-05-29 HISTORY — DX: Sarcoidosis, unspecified: D86.9

## 2018-05-29 HISTORY — DX: Gastro-esophageal reflux disease without esophagitis: K21.9

## 2018-05-29 HISTORY — DX: Essential (primary) hypertension: I10

## 2018-05-29 HISTORY — PX: AMPUTATION: SHX166

## 2018-05-29 HISTORY — DX: Lesion of ulnar nerve, unspecified upper limb: G56.20

## 2018-05-29 HISTORY — DX: Sepsis, unspecified organism: A41.9

## 2018-05-29 HISTORY — DX: Unspecified asthma, uncomplicated: J45.909

## 2018-05-29 HISTORY — DX: Acute and chronic respiratory failure with hypoxia: J96.21

## 2018-05-29 HISTORY — DX: Other acute osteomyelitis, left ankle and foot: M86.172

## 2018-05-29 HISTORY — DX: Major depressive disorder, single episode, unspecified: F32.9

## 2018-05-29 HISTORY — PX: APPLICATION OF WOUND VAC: SHX5189

## 2018-05-29 HISTORY — DX: Type 2 diabetes mellitus without complications: E11.9

## 2018-05-29 LAB — CBC WITH DIFFERENTIAL/PLATELET
Abs Immature Granulocytes: 0.1 10*3/uL — ABNORMAL HIGH (ref 0.00–0.07)
Basophils Absolute: 0 10*3/uL (ref 0.0–0.1)
Basophils Relative: 0 %
Eosinophils Absolute: 0.1 10*3/uL (ref 0.0–0.5)
Eosinophils Relative: 1 %
HCT: 26.6 % — ABNORMAL LOW (ref 36.0–46.0)
Hemoglobin: 8.4 g/dL — ABNORMAL LOW (ref 12.0–15.0)
Immature Granulocytes: 1 %
Lymphocytes Relative: 17 %
Lymphs Abs: 1.9 10*3/uL (ref 0.7–4.0)
MCH: 25.9 pg — ABNORMAL LOW (ref 26.0–34.0)
MCHC: 31.6 g/dL (ref 30.0–36.0)
MCV: 82.1 fL (ref 80.0–100.0)
Monocytes Absolute: 0.8 10*3/uL (ref 0.1–1.0)
Monocytes Relative: 7 %
Neutro Abs: 8.8 10*3/uL — ABNORMAL HIGH (ref 1.7–7.7)
Neutrophils Relative %: 74 %
Platelets: 516 10*3/uL — ABNORMAL HIGH (ref 150–400)
RBC: 3.24 MIL/uL — ABNORMAL LOW (ref 3.87–5.11)
RDW: 18.6 % — ABNORMAL HIGH (ref 11.5–15.5)
WBC: 11.7 10*3/uL — ABNORMAL HIGH (ref 4.0–10.5)
nRBC: 0 % (ref 0.0–0.2)

## 2018-05-29 LAB — GLUCOSE, CAPILLARY
Glucose-Capillary: 64 mg/dL — ABNORMAL LOW (ref 70–99)
Glucose-Capillary: 153 mg/dL — ABNORMAL HIGH (ref 70–99)
Glucose-Capillary: 86 mg/dL (ref 70–99)

## 2018-05-29 LAB — VANCOMYCIN, TROUGH: Vancomycin Tr: 25 ug/mL (ref 15–20)

## 2018-05-29 LAB — CULTURE, BLOOD (ROUTINE X 2)
Culture: NO GROWTH
Special Requests: ADEQUATE

## 2018-05-29 SURGERY — AMPUTATION BELOW KNEE
Anesthesia: General | Site: Leg Lower | Laterality: Right

## 2018-05-29 SURGERY — AMPUTATION BELOW KNEE
Anesthesia: General | Laterality: Right

## 2018-05-29 MED ORDER — PROPOFOL 10 MG/ML IV BOLUS
INTRAVENOUS | Status: DC | PRN
  Administered 2018-05-29: 50 mg via INTRAVENOUS
  Administered 2018-05-29: 20 mg via INTRAVENOUS

## 2018-05-29 MED ORDER — DEXAMETHASONE SODIUM PHOSPHATE 10 MG/ML IJ SOLN
INTRAMUSCULAR | Status: AC
  Filled 2018-05-29: qty 1

## 2018-05-29 MED ORDER — OXYCODONE HCL 5 MG PO TABS
5.0000 mg | ORAL_TABLET | Freq: Once | ORAL | Status: AC | PRN
  Administered 2018-05-29: 18:00:00 5 mg via ORAL

## 2018-05-29 MED ORDER — LACTATED RINGERS IV SOLN: INTRAVENOUS | Status: DC

## 2018-05-29 MED ORDER — PHENYLEPHRINE 40 MCG/ML (10ML) SYRINGE FOR IV PUSH (FOR BLOOD PRESSURE SUPPORT)
PREFILLED_SYRINGE | INTRAVENOUS | Status: AC
  Filled 2018-05-29: qty 10

## 2018-05-29 MED ORDER — ONDANSETRON HCL 4 MG/2ML IJ SOLN
INTRAMUSCULAR | Status: AC
  Filled 2018-05-29: qty 2

## 2018-05-29 MED ORDER — SUGAMMADEX SODIUM 200 MG/2ML IV SOLN
INTRAVENOUS | Status: DC | PRN
  Administered 2018-05-29: 200 mg via INTRAVENOUS

## 2018-05-29 MED ORDER — FENTANYL CITRATE (PF) 250 MCG/5ML IJ SOLN
INTRAMUSCULAR | Status: DC | PRN
  Administered 2018-05-29: 50 ug via INTRAVENOUS
  Administered 2018-05-29: 25 ug via INTRAVENOUS
  Administered 2018-05-29: 50 ug via INTRAVENOUS

## 2018-05-29 MED ORDER — DEXTROSE 50 % IV SOLN
INTRAVENOUS | Status: AC
  Filled 2018-05-29: qty 50

## 2018-05-29 MED ORDER — MIDAZOLAM HCL 2 MG/2ML IJ SOLN
INTRAMUSCULAR | Status: AC
  Filled 2018-05-29: qty 2

## 2018-05-29 MED ORDER — MIDAZOLAM HCL 5 MG/5ML IJ SOLN
INTRAMUSCULAR | Status: DC | PRN
  Administered 2018-05-29: 2 mg via INTRAVENOUS

## 2018-05-29 MED ORDER — CHLORHEXIDINE GLUCONATE 4 % EX LIQD: 60.0000 mL | Freq: Once | CUTANEOUS | Status: DC

## 2018-05-29 MED ORDER — MEPERIDINE HCL 25 MG/ML IJ SOLN: 6.2500 mg | INTRAMUSCULAR | Status: DC | PRN

## 2018-05-29 MED ORDER — SUCCINYLCHOLINE CHLORIDE 200 MG/10ML IV SOSY
PREFILLED_SYRINGE | INTRAVENOUS | Status: DC | PRN
  Administered 2018-05-29: 80 mg via INTRAVENOUS

## 2018-05-29 MED ORDER — 0.9 % SODIUM CHLORIDE (POUR BTL) OPTIME
TOPICAL | Status: DC | PRN
  Administered 2018-05-29: 1000 mL

## 2018-05-29 MED ORDER — ROCURONIUM BROMIDE 100 MG/10ML IV SOLN
INTRAVENOUS | Status: DC | PRN
  Administered 2018-05-29: 30 mg via INTRAVENOUS

## 2018-05-29 MED ORDER — SODIUM CHLORIDE 0.9 % IV SOLN
INTRAVENOUS | Status: DC | PRN
  Administered 2018-05-29: 16:00:00 25 ug/min via INTRAVENOUS

## 2018-05-29 MED ORDER — ROCURONIUM BROMIDE 10 MG/ML (PF) SYRINGE
PREFILLED_SYRINGE | INTRAVENOUS | Status: AC
  Filled 2018-05-29: qty 10

## 2018-05-29 MED ORDER — LACTATED RINGERS IV SOLN
INTRAVENOUS | Status: DC
  Administered 2018-05-29 (×2): via INTRAVENOUS

## 2018-05-29 MED ORDER — ACETAMINOPHEN 160 MG/5ML PO SOLN: 325.0000 mg | ORAL | Status: DC | PRN

## 2018-05-29 MED ORDER — DEXAMETHASONE SODIUM PHOSPHATE 10 MG/ML IJ SOLN
INTRAMUSCULAR | Status: DC | PRN
  Administered 2018-05-29: 5 mg via INTRAVENOUS

## 2018-05-29 MED ORDER — PROPOFOL 10 MG/ML IV BOLUS
INTRAVENOUS | Status: AC
  Filled 2018-05-29: qty 20

## 2018-05-29 MED ORDER — LIDOCAINE 2% (20 MG/ML) 5 ML SYRINGE
INTRAMUSCULAR | Status: DC | PRN
  Administered 2018-05-29: 60 mg via INTRAVENOUS

## 2018-05-29 MED ORDER — OXYCODONE HCL 5 MG PO TABS
ORAL_TABLET | ORAL | Status: AC
  Filled 2018-05-29: qty 1

## 2018-05-29 MED ORDER — CEFAZOLIN SODIUM-DEXTROSE 2-3 GM-%(50ML) IV SOLR
INTRAVENOUS | Status: DC | PRN
  Administered 2018-05-29: 2 g via INTRAVENOUS

## 2018-05-29 MED ORDER — FENTANYL CITRATE (PF) 100 MCG/2ML IJ SOLN
INTRAMUSCULAR | Status: AC
  Filled 2018-05-29: qty 2

## 2018-05-29 MED ORDER — DEXTROSE 50 % IV SOLN
25.0000 mL | Freq: Once | INTRAVENOUS | Status: AC
  Administered 2018-05-29: 15:00:00 25 mL via INTRAVENOUS
  Filled 2018-05-29: qty 50

## 2018-05-29 MED ORDER — ESMOLOL HCL 100 MG/10ML IV SOLN
INTRAVENOUS | Status: DC | PRN
  Administered 2018-05-29: 10 mg via INTRAVENOUS

## 2018-05-29 MED ORDER — FENTANYL CITRATE (PF) 100 MCG/2ML IJ SOLN
25.0000 ug | INTRAMUSCULAR | Status: DC | PRN
  Administered 2018-05-29: 25 ug via INTRAVENOUS
  Administered 2018-05-29 (×2): 50 ug via INTRAVENOUS
  Administered 2018-05-29: 17:00:00 25 ug via INTRAVENOUS

## 2018-05-29 MED ORDER — PHENYLEPHRINE HCL (PRESSORS) 10 MG/ML IV SOLN
INTRAVENOUS | Status: DC | PRN
  Administered 2018-05-29: 80 ug via INTRAVENOUS

## 2018-05-29 MED ORDER — ONDANSETRON HCL 4 MG/2ML IJ SOLN: 4.0000 mg | Freq: Once | INTRAMUSCULAR | Status: DC | PRN

## 2018-05-29 MED ORDER — EPHEDRINE SULFATE 50 MG/ML IJ SOLN
INTRAMUSCULAR | Status: DC | PRN
  Administered 2018-05-29 (×2): 2.5 mg via INTRAVENOUS

## 2018-05-29 MED ORDER — ONDANSETRON HCL 4 MG/2ML IJ SOLN
INTRAMUSCULAR | Status: DC | PRN
  Administered 2018-05-29: 4 mg via INTRAVENOUS

## 2018-05-29 MED ORDER — OXYCODONE HCL 5 MG/5ML PO SOLN: 5.0000 mg | Freq: Once | ORAL | Status: AC | PRN

## 2018-05-29 MED ORDER — ACETAMINOPHEN 325 MG PO TABS: 325.0000 mg | ORAL_TABLET | ORAL | Status: DC | PRN

## 2018-05-29 MED ORDER — CEFAZOLIN SODIUM-DEXTROSE 2-4 GM/100ML-% IV SOLN: 2.0000 g | INTRAVENOUS | Status: DC

## 2018-05-29 MED ORDER — LACTATED RINGERS IV SOLN
INTRAVENOUS | Status: DC
  Administered 2018-05-29: 15:00:00 1000 mL via INTRAVENOUS

## 2018-05-29 MED ORDER — FENTANYL CITRATE (PF) 250 MCG/5ML IJ SOLN
INTRAMUSCULAR | Status: AC
  Filled 2018-05-29: qty 5

## 2018-05-29 MED ORDER — CEFAZOLIN SODIUM-DEXTROSE 2-4 GM/100ML-% IV SOLN
INTRAVENOUS | Status: AC
  Filled 2018-05-29: qty 100

## 2018-05-29 SURGICAL SUPPLY — 37 items
BLADE SAW RECIP 87.9 MT (BLADE) ×5 IMPLANT
BLADE SURG 21 STRL SS (BLADE) ×3 IMPLANT
BNDG COHESIVE 6X5 TAN STRL LF (GAUZE/BANDAGES/DRESSINGS) ×2 IMPLANT
CANISTER WOUND CARE 500ML ATS (WOUND CARE) ×3 IMPLANT
COVER SURGICAL LIGHT HANDLE (MISCELLANEOUS) ×3 IMPLANT
COVER WAND RF STERILE (DRAPES) IMPLANT
CUFF TOURNIQUET SINGLE 34IN LL (TOURNIQUET CUFF) ×3 IMPLANT
DRAPE INCISE IOBAN 66X45 STRL (DRAPES) ×3 IMPLANT
DRAPE U-SHAPE 47X51 STRL (DRAPES) ×3 IMPLANT
DRESSING PREVENA PLUS CUSTOM (GAUZE/BANDAGES/DRESSINGS) ×1 IMPLANT
DRSG PREVENA PLUS CUSTOM (GAUZE/BANDAGES/DRESSINGS) ×3
DURAPREP 26ML APPLICATOR (WOUND CARE) ×3 IMPLANT
ELECT REM PT RETURN 9FT ADLT (ELECTROSURGICAL) ×3
ELECTRODE REM PT RTRN 9FT ADLT (ELECTROSURGICAL) ×1 IMPLANT
GLOVE BIOGEL PI IND STRL 9 (GLOVE) ×1 IMPLANT
GLOVE BIOGEL PI INDICATOR 9 (GLOVE) ×2
GLOVE SURG ORTHO 9.0 STRL STRW (GLOVE) ×3 IMPLANT
GOWN STRL REUS W/ TWL XL LVL3 (GOWN DISPOSABLE) ×2 IMPLANT
GOWN STRL REUS W/TWL XL LVL3 (GOWN DISPOSABLE) ×4
KIT BASIN OR (CUSTOM PROCEDURE TRAY) ×3 IMPLANT
KIT TURNOVER KIT B (KITS) ×3 IMPLANT
MANIFOLD NEPTUNE II (INSTRUMENTS) ×3 IMPLANT
NS IRRIG 1000ML POUR BTL (IV SOLUTION) ×3 IMPLANT
PACK ORTHO EXTREMITY (CUSTOM PROCEDURE TRAY) ×3 IMPLANT
PAD ARMBOARD 7.5X6 YLW CONV (MISCELLANEOUS) ×3 IMPLANT
PREVENA RESTOR ARTHOFORM 46X30 (CANNISTER) ×3 IMPLANT
SPONGE LAP 18X18 RF (DISPOSABLE) IMPLANT
STAPLER VISISTAT 35W (STAPLE) IMPLANT
STOCKINETTE IMPERVIOUS LG (DRAPES) ×3 IMPLANT
SUT ETHILON 2 0 PSLX (SUTURE) IMPLANT
SUT SILK 2 0 (SUTURE) ×2
SUT SILK 2-0 18XBRD TIE 12 (SUTURE) ×1 IMPLANT
SUT VIC AB 1 CTX 27 (SUTURE) ×6 IMPLANT
TOWEL OR 17X26 10 PK STRL BLUE (TOWEL DISPOSABLE) ×3 IMPLANT
TUBE CONNECTING 12'X1/4 (SUCTIONS) ×1
TUBE CONNECTING 12X1/4 (SUCTIONS) ×2 IMPLANT
YANKAUER SUCT BULB TIP NO VENT (SUCTIONS) ×3 IMPLANT

## 2018-05-29 NOTE — Interval H&P Note (Signed)
History and Physical Interval Note:  05/29/2018 4:59 PM  Rachel Proctor  has presented today for surgery, with the diagnosis of right osteomylitis foot.  The various methods of treatment have been discussed with the patient and family. After consideration of risks, benefits and other options for treatment, the patient has consented to  Procedure(s): AMPUTATION BELOW KNEE (Right) Application Of Wound Vac (Right) as a surgical intervention.  The patient's history has been reviewed, patient examined, no change in status, stable for surgery.  I have reviewed the patient's chart and labs.  Questions were answered to the patient's satisfaction.     Nadara Mustard

## 2018-05-29 NOTE — Transfer of Care (Signed)
Immediate Anesthesia Transfer of Care Note  Patient: Rachel Proctor  Procedure(s) Performed: AMPUTATION BELOW KNEE (Right Leg Lower) Application Of Wound Vac (Right Leg Lower)  Patient Location: PACU  Anesthesia Type:General  Level of Consciousness: awake, alert  and oriented  Airway & Oxygen Therapy: Patient Spontanous Breathing and Patient connected to face mask oxygen  Post-op Assessment: Report given to RN and Post -op Vital signs reviewed and stable  Post vital signs: Reviewed and stable  Last Vitals:  Vitals Value Taken Time  BP 140/68 05/29/2018  4:55 PM  Temp    Pulse 95 05/29/2018  4:55 PM  Resp 20 05/29/2018  4:55 PM  SpO2 100 % 05/29/2018  4:55 PM  Vitals shown include unvalidated device data.  Last Pain: There were no vitals filed for this visit.       Complications: No apparent anesthesia complications

## 2018-05-29 NOTE — Anesthesia Procedure Notes (Signed)
Procedure Name: Intubation Date/Time: 05/29/2018 3:56 PM Performed by: Jearld Pies, CRNA Pre-anesthesia Checklist: Patient identified, Emergency Drugs available, Suction available and Patient being monitored Patient Re-evaluated:Patient Re-evaluated prior to induction Oxygen Delivery Method: Circle System Utilized Preoxygenation: Pre-oxygenation with 100% oxygen Induction Type: IV induction and Rapid sequence Laryngoscope Size: Mac and 3 Grade View: Grade I Tube type: Oral Tube size: 7.0 mm Number of attempts: 1 Airway Equipment and Method: Stylet and Oral airway Placement Confirmation: ETT inserted through vocal cords under direct vision,  positive ETCO2 and breath sounds checked- equal and bilateral Secured at: 21 cm Tube secured with: Tape Dental Injury: Teeth and Oropharynx as per pre-operative assessment

## 2018-05-29 NOTE — Op Note (Signed)
   Date of Surgery: 05/29/2018  INDICATIONS: Ms. Sliwa is a 63 y.o.-year-old female who is status post foot salvage surgery on the right.  She has had a transmetatarsal amputation but still has persistent osteomyelitis and ulceration.Marland Kitchen  PREOPERATIVE DIAGNOSIS: Osteomyelitis ulceration abscess right transmetatarsal amputation.  POSTOPERATIVE DIAGNOSIS: Same.  PROCEDURE: Transtibial amputation Application of Prevena wound VAC  SURGEON: Lajoyce Corners, M.D.  ANESTHESIA:  general  IV FLUIDS AND URINE: See anesthesia.  ESTIMATED BLOOD LOSS: 50 mL.  COMPLICATIONS: None.  DESCRIPTION OF PROCEDURE: The patient was brought to the operating room and underwent a general anesthetic. After adequate levels of anesthesia were obtained patient's lower extremity was prepped using DuraPrep draped into a sterile field. A timeout was called. The foot was draped out of the sterile field with impervious stockinette. A transverse incision was made 11 cm distal to the tibial tubercle. This curved proximally and a large posterior flap was created. The tibia was transected 1 cm proximal to the skin incision. The fibula was transected just proximal to the tibial incision. The tibia was beveled anteriorly. A large posterior flap was created. The sciatic nerve was pulled cut and allowed to retract. The vascular bundles were suture ligated with 2-0 silk. The deep and superficial fascial layers were closed using #1 Vicryl. The skin was closed using staples and 2-0 nylon. The wound was covered with a Prevena restore and customizable wound VAC. There was a good suction fit. A prosthetic shrinker will be applied. Patient was extubated taken to the PACU in stable condition.   DISCHARGE PLANNING:  Antibiotic duration: 24-hour postoperative antibiotics  Weightbearing: Nonweightbearing on the right  Pain medication: Opioid pathway  Dressing care/ Wound VAC: Continue wound VAC for 1 week  [facility]  Follow-up: In the office 1 week post operative.  Aldean Baker, MD [facility] 4:49 PM

## 2018-05-29 NOTE — H&P (Signed)
Rachel Proctor is an 63 y.o. female.   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  She presents for right transtibial amputation today.  Past Medical History DM Type 2 with polyneuropathy Hypothyroidism Protein calorie malnutrition Renal insufficiency History of metabolic encephalopathy History of polysubstance abuse  Past surgical history Previous bilateral transmetatarsal amputations  No family history on file. Social History:  has no history on file for tobacco, alcohol, and drug.  Allergies: Allergies not on file  No medications prior to admission.    Results for orders placed or performed during the hospital encounter of 05/08/18 (from the past 48 hour(s))  Vancomycin, trough     Status: Abnormal   Collection Time: 05/27/18  5:14 PM  Result Value Ref Range   Vancomycin Tr 9 (L) 15 - 20 ug/mL    Comment: Performed at [facility], [address], [place], [place] [address]  CBC     Status: Abnormal   Collection Time: 05/28/18  5:22 AM  Result Value Ref Range   WBC 10.8 (H) 4.0 - 10.5 K/uL   RBC 2.99 (L) 3.87 - 5.11 MIL/uL   Hemoglobin 7.7 (L) 12.0 - 15.0 g/dL   HCT 53.7 (L) 48.2 - 70.7 %   MCV 82.3 80.0 - 100.0 fL   MCH 25.8 (L) 26.0 - 34.0 pg   MCHC 31.3 30.0 - 36.0 g/dL   RDW 86.7 (H) 54.4 - 92.0 %   Platelets 480 (H) 150 - 400 K/uL   nRBC 0.2 0.0 - 0.2 %    Comment: Performed at [facility], [address] [place], [place] [address]  Basic metabolic panel     Status: Abnormal   Collection Time: 05/28/18  5:22 AM  Result Value Ref Range   Sodium 136 135 - 145 mmol/L   Potassium 3.1 (L) 3.5 - 5.1 mmol/L   Chloride 98 98 - 111 mmol/L   CO2 28 22 - 32 mmol/L   Glucose, Bld 85 70 - 99 mg/dL   BUN 14 8 - 23 mg/dL   Creatinine, Ser 2.19 0.44 - 1.00 mg/dL   Calcium 8.0 (L) 8.9 - 10.3 mg/dL   GFR calc non Af Amer >60 >60 mL/min   GFR calc Af Amer >60 >60 mL/min   Anion gap 10 5 - 15    Comment: Performed at [facility], [address] [place], [place] [address]  CBC with Differential/Platelet     Status: Abnormal   Collection Time: 05/29/18  5:45 AM  Result Value Ref Range   WBC 11.7 (H) 4.0 - 10.5 K/uL   RBC 3.24 (L) 3.87 - 5.11 MIL/uL   Hemoglobin 8.4 (L) 12.0 - 15.0 g/dL   HCT 25.4 (L) 98.2 - 64.1 %   MCV 82.1 80.0 - 100.0 fL   MCH 25.9 (L) 26.0 - 34.0 pg   MCHC 31.6 30.0 - 36.0 g/dL   RDW 58.3 (H) 09.4 - 07.6 %   Platelets 516 (H) 150 - 400 K/uL   nRBC 0.0 0.0 - 0.2 %   Neutrophils Relative % 74 %   Neutro Abs 8.8 (H) 1.7 - 7.7 K/uL   Lymphocytes Relative 17 %   Lymphs Abs 1.9 0.7 - 4.0 K/uL   Monocytes Relative 7 %   Monocytes Absolute 0.8 0.1 -  1.0 K/uL   Eosinophils Relative 1 %   Eosinophils Absolute 0.1 0.0 - 0.5 K/uL   Basophils Relative 0 %   Basophils Absolute 0.0 0.0 - 0.1 K/uL   Immature Granulocytes 1 %   Abs Immature Granulocytes 0.10 (H) 0.00 - 0.07 K/uL    Comment: Performed at [facility], [address] [place], [place] [address]  Vancomycin, trough     Status: Abnormal   Collection Time: 05/29/18  5:45 AM  Result Value Ref Range   Vancomycin Tr 25 (HH) 15 - 20 ug/mL    Comment: CRITICAL RESULT CALLED TO, READ BACK BY AND VERIFIED WITH: A PATTERSON,RN [phone] [facility] Performed at [facility], [address] [place], [place] [address]    No results found.  Review of Systems  All other systems reviewed and are negative.   There were no vitals taken for this visit. Physical Exam  Constitutional: She is oriented to person, place, and time. She appears well-developed and well-nourished. No distress.  HENT:  Head: Normocephalic and atraumatic.  Neck: No tracheal deviation present. No thyromegaly present.  Cardiovascular: Normal rate  and regular rhythm.  Respiratory: Effort normal. No stridor. No respiratory distress.  GI: Soft.  Musculoskeletal:     Comments: Examination patient has multiple ulcers on the right transmetatarsal amputation.  She does have a palpable pulse her foot is warm.   Neurological: She is alert and oriented to person, place, and time.  Skin: Skin is warm.  Psychiatric: She has a normal mood and affect. Her behavior is normal. Judgment and thought content normal.     Assessment/Plan Osteomyelitis right transmetatarsal amputation- plan right transtibial amputation-the procedure and benefits and risks including the risk of bleeding, infection, neurovascular injury, and possible need for further surgery were discussed with the patient and questions answered to her satisfaction.  The patient desires to proceed at this time.  Lazaro ArmsSHAWN Tirso Laws, PA-C 05/29/2018, 7:46 AM [facility] [phone]

## 2018-05-29 NOTE — Anesthesia Preprocedure Evaluation (Deleted)
Anesthesia Evaluation  Patient identified by MRN, date of birth, ID band Patient awake    Reviewed: Allergy & Precautions, H&P , NPO status , Patient's Chart, lab work & pertinent test results, reviewed documented beta blocker date and time   Airway Mallampati: II  TM Distance: >3 FB Neck ROM: full    Dental no notable dental hx.    Pulmonary asthma ,    Pulmonary exam normal breath sounds clear to auscultation       Cardiovascular Exercise Tolerance: Good hypertension, Pt. on medications and Pt. on home beta blockers  Rhythm:regular Rate:Normal     Neuro/Psych  Neuromuscular disease negative psych ROS   GI/Hepatic Neg liver ROS, GERD  Medicated,  Endo/Other  diabetes, Type 2Hypothyroidism   Renal/GU negative Renal ROS  negative genitourinary   Musculoskeletal   Abdominal   Peds  Hematology  (+) Blood dyscrasia, anemia ,   Anesthesia Other Findings   Reproductive/Obstetrics negative OB ROS                            Anesthesia Physical Anesthesia Plan  ASA: III  Anesthesia Plan: General   Post-op Pain Management:    Induction: Intravenous  PONV Risk Score and Plan: 3 and Treatment may vary due to age or medical condition, Ondansetron and Midazolam  Airway Management Planned: Oral ETT and LMA  Additional Equipment:   Intra-op Plan:   Post-operative Plan: Extubation in OR  Informed Consent: I have reviewed the patients History and Physical, chart, labs and discussed the procedure including the risks, benefits and alternatives for the proposed anesthesia with the patient or authorized representative who has indicated his/her understanding and acceptance.     Dental Advisory Given  Plan Discussed with: CRNA, Anesthesiologist and Surgeon  Anesthesia Plan Comments: (  )        Anesthesia Quick Evaluation

## 2018-05-29 NOTE — Discharge Summary (Signed)
Discharge Diagnoses:  Active Problems:   * No active hospital problems. * right foot osteomyelitis  Surgeries: Procedure(s): AMPUTATION BELOW KNEE Application Of Wound Vac on 05/29/2018    Consultants:   Discharged Condition: Improved  Hospital Course: Rachel BlueJanice Cellucci is an 63 y.o. female who was admitted 05/29/2018 with a chief complaint of ulcers and infection of right foot, with a final diagnosis of right osteomylitis foot.  Patient was brought to the operating room on 05/29/2018 and underwent Procedure(s): AMPUTATION BELOW KNEE Application Of Wound Vac.    Patient was given perioperative antibiotics:  Anti-infectives (From admission, onward)   Start     Dose/Rate Route Frequency Ordered Stop   05/29/18 1401  ceFAZolin (ANCEF) 2-4 GM/100ML-% IVPB    Note to Pharmacy:  Blair Promisehomsen, Meredith   : cabinet override      05/29/18 1401 05/30/18 0214    .  Patient was given sequential compression devices, early ambulation, and aspirin for DVT prophylaxis.  Recent vital signs: No data found..  Recent laboratory studies: No results found.  Discharge Medications:   Allergies as of 05/29/2018      Reactions   Iodine Anaphylaxis   Morphine And Related Anaphylaxis   Shellfish Allergy Anaphylaxis   Bee Venom Hives   Pravastatin Other (See Comments)   Leg cramps      Medication List    TAKE these medications   acetaminophen 325 MG tablet Commonly known as:  TYLENOL Take by mouth every 6 (six) hours as needed (unknown).   amitriptyline 10 MG tablet Commonly known as:  ELAVIL Take 10 mg by mouth at bedtime.   carvedilol 12.5 MG tablet Commonly known as:  COREG Take 12.5 mg by mouth 2 (two) times daily with a meal.   citalopram 20 MG tablet Commonly known as:  CELEXA Take 20 mg by mouth daily.   dextrose 5 % SOLN 50 mL with magnesium sulfate 50 % SOLN 1 g Inject 1 g into the vein as needed (low).   docusate sodium 100 MG capsule Commonly known as:  COLACE Take 100 mg by mouth 2  (two) times daily.   feeding supplement (PRO-STAT SUGAR FREE 64) Liqd Take 30 mLs by mouth 2 (two) times a day.   ferrous sulfate 325 (65 FE) MG tablet Take 325 mg by mouth 2 (two) times daily with a meal.   gabapentin 300 MG capsule Commonly known as:  NEURONTIN Take 300 mg by mouth 3 (three) times daily.   Glucagon Emergency 1 MG Solr Generic drug:  Glucagon HCl Inject 1 mg as directed once.   hydrALAZINE 10 MG tablet Commonly known as:  APRESOLINE Take 10 mg by mouth as needed (Blood pressure).   hydrochlorothiazide 25 MG tablet Commonly known as:  HYDRODIURIL Take 25 mg by mouth daily.   insulin glargine 100 UNIT/ML injection Commonly known as:  LANTUS Inject 30 Units into the skin at bedtime.   losartan 50 MG tablet Commonly known as:  COZAAR Take 50 mg by mouth daily.   magnesium oxide 400 MG tablet Commonly known as:  MAG-OX Take 400 mg by mouth as needed (low).   multivitamin with minerals Tabs tablet Take 1 tablet by mouth daily.   oxyCODONE 5 MG immediate release tablet Commonly known as:  Oxy IR/ROXICODONE Take 5 mg by mouth every 4 (four) hours as needed for severe pain.   pantoprazole 40 MG tablet Commonly known as:  PROTONIX Take 40 mg by mouth 2 (two) times daily.   piperacillin-tazobactam  3.375 GM/50ML IVPB Commonly known as:  ZOSYN Inject 3.375 g into the vein 3 (three) times daily.   polyethylene glycol 17 g packet Commonly known as:  MIRALAX / GLYCOLAX Take 1 Container by mouth 2 (two) times a day.   potassium chloride 10 MEQ tablet Commonly known as:  K-DUR Take 10 mEq by mouth as needed (low).   potassium chloride 20 MEQ packet Commonly known as:  KLOR-CON Take 20 mEq by mouth as needed (low).   POTASSIUM EFFERVESCENT PO Take 20 mg by mouth as needed (low).   rifampin 300 MG capsule Commonly known as:  RIFADIN Take 300 mg by mouth 2 (two) times daily.   sodium polystyrene powder Commonly known as:  KAYEXALATE Take 15 g by  mouth as needed (low).   vancomycin 1,000 mg in sodium chloride 0.9 % 250 mL Inject 1,250 mg into the vein every 12 (twelve) hours.       Diagnostic Studies: Ct Chest W Contrast  Result Date: 05/26/2018 CLINICAL DATA:  Actinomycosis.  Abscess. EXAM: CT CHEST WITH CONTRAST TECHNIQUE: Multidetector CT imaging of the chest was performed during intravenous contrast administration. CONTRAST:  OMNIPAQUE IOHEXOL 300 MG/ML  SOLN COMPARISON:  None. FINDINGS: Cardiovascular: Atherosclerotic atherosclerotic calcification of the aortic arch and branch vessels. Mild cardiomegaly. Small pericardial effusion. Mediastinum/Nodes: Right lower paratracheal node anterior to the carina measures 1.0 cm in short axis on image 45/3. Indistinct right hilar node approximately 1.0 cm in short axis, image 54/3. Lungs/Pleura: Small bilateral pleural effusions with passive atelectasis. These effusions are nonspecific for exudative or transudative etiology. Bilateral scattered pulmonary nodules of variable size, some with internal cavitation, for the most part peripherally distributed in the lungs. A right upper lobe nodule with mild internal cavitation measures 1.6 by 1.5 cm on image 49/4. An index left upper lobe nodule also with a small amount of internal cavitation measures 1.2 by 1.3 cm on image 54/4. No findings of chest wall involvement/empyema necessitans. Upper Abdomen: Unremarkable Musculoskeletal: Old healed fracture of the left posterolateral fifth, sixth, seventh, and eighth ribs. Minimal thoracic spondylosis. IMPRESSION: 1. Pleural effusion with scattered bilateral pulmonary nodules some of which are mildly cavitary. This appearance, while nonspecific, would certainly be very reasonably consistent with active pulmonary actinomycosis. Other entities which can cause cavitating lung nodules include tuberculosis, cavitating metastatic disease such as squamous cell carcinoma, noninfectious granuloma such as rheumatoid  nodules, pulmonary infarcts, and other atypical infectious processes such as coccidiomycosis, nocardiosis, or cryptococcosis. There are no findings of empyema necessitans. 2. Borderline enlarged right lower paratracheal and right hilar lymph nodes. 3. Aortic Atherosclerosis (ICD10-I70.0). Mild cardiomegaly with small pericardial effusion. Electronically Signed   By: Gaylyn Rong M.D.   On: 05/26/2018 17:16   Ct Foot Right W Contrast  Result Date: 05/26/2018 CLINICAL DATA:  Osteomyelitis.  Actinomycosis. EXAM: CT OF THE LOWER RIGHT EXTREMITY WITH CONTRAST TECHNIQUE: Multidetector CT imaging of the lower right extremity was performed according to the standard protocol following intravenous contrast administration. COMPARISON:  None. CONTRAST:  OMNIPAQUE IOHEXOL 300 MG/ML  SOLN FINDINGS: Bones/Joint/Cartilage There has been amputation of the toes at the mid to distal metatarsal level with a bridging bony bar of callus between the distal metatarsals as shown for example on images 13 through 36 of series 6. On images 34 through 43 of series 8, the soft tissues overlying this bony bar particularly in the vicinity of the second metatarsal are prominently thinned and there is lucency in the bony bar dorsally suspicious  for erosion and potentially representing active osteomyelitis although not entirely specific. Plantar calcaneal spur noted. Moderate bony demineralization. Dorsal spurring of the navicular. There is some mild spurring dorsally of the distal medial cuneiform. Articular space narrowing between the cuneiforms noted. Ligaments Suboptimally assessed by CT. Muscles and Tendons Notable fusiform thickening of the Achilles tendon up to 1.6 cm anterior-posterior. The epicenter of this thickening is about 4.5 cm proximal to the insertion site. Possible thickening of the tibialis posterior tendon distally. Thickened medial band of the plantar fascia proximally, with edema signal extending from the medial  band of the plantar fascia towards the plantar cutaneous surface for example on image 26/8. Severe regional muscular atrophy. Soft tissues Scattered subcutaneous edema is present along the heel, medial ankle, and plantar to the distal margin of the metatarsals. No gas tracking in the soft tissues identified. No obvious subcutaneous abscess. IMPRESSION: 1. Prior amputation of the toes at the level of the mid to distal metatarsals with a transverse bony intermetatarsal bar in this vicinity. Soft tissues overlying the second metatarsal portion of this bony bar are extremely thin, and there is some lucency in the bone at this location concerning for the possibility of active osteomyelitis. Correlate with appearance of the skin in this area. 2. Prominent fusiform thickening of the Achilles tendon compatible with tendinopathy. 3. Plantar fasciitis. 4. Subcutaneous edema plantar to the intermetatarsal bony bar. 5. Marked muscular atrophy. 6. Possible tendinopathy of the distal tibialis posterior tendon, which appears somewhat thickened. Electronically Signed   By: Gaylyn Rong M.D.   On: 05/26/2018 17:34   Vas [place] Upper Extremity Venous Duplex  Result Date: 05/11/2018 UPPER VENOUS STUDY  Indications: Swelling Limitations: Edema and inability to extend arm. Comparison Study: No prior study on file Performing Technologist: Sherren Kerns RVS  Examination Guidelines: A complete evaluation includes B-mode imaging, spectral Doppler, color Doppler, and power Doppler as needed of all accessible portions of each vessel. Bilateral testing is considered an integral part of a complete examination. Limited examinations for reoccurring indications may be performed as noted.  Right Findings: +----------+------------+---------+-----------+----------+-------+ RIGHT     CompressiblePhasicitySpontaneousPropertiesSummary +----------+------------+---------+-----------+----------+-------+ Subclavian               Yes        Yes                      +----------+------------+---------+-----------+----------+-------+  Left Findings: +----------+------------+---------+-----------+----------+----------+ LEFT      CompressiblePhasicitySpontaneousProperties Summary   +----------+------------+---------+-----------+----------+----------+ IJV           Full       Yes       Yes                         +----------+------------+---------+-----------+----------+----------+ Subclavian               Yes       Yes                         +----------+------------+---------+-----------+----------+----------+ Axillary                 Yes       Yes                         +----------+------------+---------+-----------+----------+----------+ Brachial      Full       Yes       Yes                         +----------+------------+---------+-----------+----------+----------+  Radial                                              visualized +----------+------------+---------+-----------+----------+----------+ Ulnar                                               visualized +----------+------------+---------+-----------+----------+----------+ Cephalic      Full                                             +----------+------------+---------+-----------+----------+----------+ Basilic       Full                                             +----------+------------+---------+-----------+----------+----------+  Summary:  Right: No evidence of thrombosis in the subclavian.  Left: No evidence of deep vein thrombosis in the upper extremity. No evidence of superficial vein thrombosis in the upper extremity. Significant interstitial fluid throughout the left upper extremity.  *See table(s) above for measurements and observations.  Diagnosing physician: Coral Else MD Electronically signed by Coral Else MD on 05/11/2018 at 1:01:40 PM.    Final     Patient benefited maximally from their hospital stay and  there were no complications.     Disposition: Discharge disposition: [facility] Not Defined      Discharge Instructions    Call MD / Call 911   Complete by:  As directed    If you experience chest pain or shortness of breath, CALL 911 and be transported to the hospital emergency room.  If you develope a fever above 101 F, pus (white drainage) or increased drainage or redness at the wound, or calf pain, call your surgeon's office.   Constipation Prevention   Complete by:  As directed    Drink plenty of fluids.  Prune juice may be helpful.  You may use a stool softener, such as Colace (over the counter) 100 mg twice a day.  Use MiraLax (over the counter) for constipation as needed.   Diet - low sodium heart healthy   Complete by:  As directed    Increase activity slowly as tolerated   Complete by:  As directed      Follow-up Information    Nadara Mustard, MD In 1 week.   Specialty:  Orthopedic Surgery Contact information: [address] [place] [address] [phone]          Please keep the [facility] in place for at least 1 week following surgery. Keep Prevena plugged into wall outlet to keep machine charged.     SignedLazaro Arms, PA-C 05/29/2018, 4:53 PM [facility] [phone]

## 2018-05-30 LAB — CBC
HCT: 23 % — ABNORMAL LOW (ref 36.0–46.0)
Hemoglobin: 7.1 g/dL — ABNORMAL LOW (ref 12.0–15.0)
MCH: 25.2 pg — ABNORMAL LOW (ref 26.0–34.0)
MCHC: 30.9 g/dL (ref 30.0–36.0)
MCV: 81.6 fL (ref 80.0–100.0)
Platelets: 457 10*3/uL — ABNORMAL HIGH (ref 150–400)
RBC: 2.82 MIL/uL — ABNORMAL LOW (ref 3.87–5.11)
RDW: 18.6 % — ABNORMAL HIGH (ref 11.5–15.5)
WBC: 12.3 10*3/uL — ABNORMAL HIGH (ref 4.0–10.5)
nRBC: 0 % (ref 0.0–0.2)

## 2018-05-30 LAB — VANCOMYCIN, TROUGH: Vancomycin Tr: 12 ug/mL — ABNORMAL LOW (ref 15–20)

## 2018-05-31 LAB — CBC
HCT: 21.8 % — ABNORMAL LOW (ref 36.0–46.0)
Hemoglobin: 6.7 g/dL — CL (ref 12.0–15.0)
MCH: 25.4 pg — ABNORMAL LOW (ref 26.0–34.0)
MCHC: 30.7 g/dL (ref 30.0–36.0)
MCV: 82.6 fL (ref 80.0–100.0)
Platelets: 501 10*3/uL — ABNORMAL HIGH (ref 150–400)
RBC: 2.64 MIL/uL — ABNORMAL LOW (ref 3.87–5.11)
RDW: 18.3 % — ABNORMAL HIGH (ref 11.5–15.5)
WBC: 11.9 10*3/uL — ABNORMAL HIGH (ref 4.0–10.5)
nRBC: 0 % (ref 0.0–0.2)

## 2018-05-31 LAB — VANCOMYCIN, TROUGH: Vancomycin Tr: 29 ug/mL (ref 15–20)

## 2018-05-31 LAB — BASIC METABOLIC PANEL
Anion gap: 11 (ref 5–15)
BUN: 17 mg/dL (ref 8–23)
CO2: 30 mmol/L (ref 22–32)
Calcium: 7.9 mg/dL — ABNORMAL LOW (ref 8.9–10.3)
Chloride: 94 mmol/L — ABNORMAL LOW (ref 98–111)
Creatinine, Ser: 0.92 mg/dL (ref 0.44–1.00)
GFR calc Af Amer: >60 mL/min (ref >60)
GFR calc non Af Amer: >60 mL/min (ref >60)
Glucose, Bld: 77 mg/dL (ref 70–99)
Potassium: 3.1 mmol/L — ABNORMAL LOW (ref 3.5–5.1)
Sodium: 135 mmol/L (ref 135–145)

## 2018-05-31 LAB — PREPARE RBC (CROSSMATCH)

## 2018-05-31 NOTE — Anesthesia Preprocedure Evaluation (Signed)
Anesthesia Evaluation  Patient identified by MRN, date of birth, ID band Patient awake    Reviewed: Allergy & Precautions, NPO status , Patient's Chart, lab work & pertinent test results  History of Anesthesia Complications Negative for: history of anesthetic complications  Airway Mallampati: II  TM Distance: >3 FB Neck ROM: Full    Dental  (+) Dental Advisory Given   Pulmonary asthma ,    breath sounds clear to auscultation       Cardiovascular hypertension, Pt. on medications  Rhythm:Regular     Neuro/Psych PSYCHIATRIC DISORDERS Depression  Neuromuscular disease    GI/Hepatic GERD  ,  Endo/Other  diabetes, Type 2  Renal/GU      Musculoskeletal   Abdominal   Peds  Hematology  (+) Blood dyscrasia, anemia ,   Anesthesia Other Findings   Reproductive/Obstetrics                             Anesthesia Physical Anesthesia Plan  ASA: III  Anesthesia Plan: General   Post-op Pain Management:    Induction: Intravenous  PONV Risk Score and Plan: 3 and Ondansetron and Dexamethasone  Airway Management Planned: Oral ETT  Additional Equipment: None  Intra-op Plan:   Post-operative Plan: Extubation in OR  Informed Consent: I have reviewed the patients History and Physical, chart, labs and discussed the procedure including the risks, benefits and alternatives for the proposed anesthesia with the patient or authorized representative who has indicated his/her understanding and acceptance.     Dental advisory given  Plan Discussed with: Surgeon and CRNA  Anesthesia Plan Comments:         Anesthesia Quick Evaluation

## 2018-05-31 NOTE — Anesthesia Postprocedure Evaluation (Signed)
Anesthesia Post Note  Patient: Rachel Proctor  Procedure(s) Performed: AMPUTATION BELOW KNEE (Right Leg Lower) Application Of Wound Vac (Right Leg Lower)     Patient location during evaluation: PACU Anesthesia Type: General Level of consciousness: awake and alert Pain management: pain level controlled Vital Signs Assessment: post-procedure vital signs reviewed and stable Respiratory status: spontaneous breathing, nonlabored ventilation, respiratory function stable and patient connected to nasal cannula oxygen Cardiovascular status: blood pressure returned to baseline and stable Postop Assessment: no apparent nausea or vomiting Anesthetic complications: no    Last Vitals:  Vitals:   05/29/18 1755 05/29/18 1810  BP: 138/82 (!) 149/70  Pulse: 94 95  Resp: 18 15  Temp: 37.3 C   SpO2: 95% 97%    Last Pain:  Vitals:   05/29/18 1745  PainSc: 7                  Gloristine Turrubiates

## 2018-06-01 ENCOUNTER — Encounter (HOSPITAL_COMMUNITY): Payer: Self-pay | Admitting: Orthopedic Surgery

## 2018-06-01 LAB — CBC
HCT: 22.8 % — ABNORMAL LOW (ref 36.0–46.0)
Hemoglobin: 7.2 g/dL — ABNORMAL LOW (ref 12.0–15.0)
MCH: 26.1 pg (ref 26.0–34.0)
MCHC: 31.6 g/dL (ref 30.0–36.0)
MCV: 82.6 fL (ref 80.0–100.0)
Platelets: 471 10*3/uL — ABNORMAL HIGH (ref 150–400)
RBC: 2.76 MIL/uL — ABNORMAL LOW (ref 3.87–5.11)
RDW: 17.8 % — ABNORMAL HIGH (ref 11.5–15.5)
WBC: 13.2 10*3/uL — ABNORMAL HIGH (ref 4.0–10.5)
nRBC: 0 % (ref 0.0–0.2)

## 2018-06-01 NOTE — Progress Notes (Signed)
Patient ID: Rachel Proctor, female   DOB: 1956/01/17, 63 y.o.   MRN: [id number] Patient is status post right transtibial amputation.  The portable wound VAC is in place.  It is plugged and functioning well no drainage.  Patient is to keep this in place for 1 week and then discontinue the VAC dressing and start with regular dressings.

## 2018-06-02 LAB — CBC
HCT: 23.5 % — ABNORMAL LOW (ref 36.0–46.0)
Hemoglobin: 7.5 g/dL — ABNORMAL LOW (ref 12.0–15.0)
MCH: 26.2 pg (ref 26.0–34.0)
MCHC: 31.9 g/dL (ref 30.0–36.0)
MCV: 82.2 fL (ref 80.0–100.0)
Platelets: 491 10*3/uL — ABNORMAL HIGH (ref 150–400)
RBC: 2.86 MIL/uL — ABNORMAL LOW (ref 3.87–5.11)
RDW: 18.1 % — ABNORMAL HIGH (ref 11.5–15.5)
WBC: 11.5 10*3/uL — ABNORMAL HIGH (ref 4.0–10.5)
nRBC: 0 % (ref 0.0–0.2)

## 2018-06-02 LAB — TYPE AND SCREEN
ABO/RH(D): O POS
Antibody Screen: NEGATIVE
Unit division: 0

## 2018-06-02 LAB — BPAM RBC
Blood Product Expiration Date: 202005232359
ISSUE DATE / TIME: 202005101213
Unit Type and Rh: 5100

## 2018-06-03 LAB — CULTURE, BLOOD (ROUTINE X 2)
Culture: NO GROWTH
Culture: NO GROWTH
Special Requests: ADEQUATE
Special Requests: ADEQUATE

## 2018-06-03 LAB — POTASSIUM: Potassium: 3.4 mmol/L — ABNORMAL LOW (ref 3.5–5.1)

## 2018-06-03 LAB — CBC
HCT: 22.4 % — ABNORMAL LOW (ref 36.0–46.0)
Hemoglobin: 7.2 g/dL — ABNORMAL LOW (ref 12.0–15.0)
MCH: 26.5 pg (ref 26.0–34.0)
MCHC: 32.1 g/dL (ref 30.0–36.0)
MCV: 82.4 fL (ref 80.0–100.0)
Platelets: 505 10*3/uL — ABNORMAL HIGH (ref 150–400)
RBC: 2.72 MIL/uL — ABNORMAL LOW (ref 3.87–5.11)
RDW: 18.4 % — ABNORMAL HIGH (ref 11.5–15.5)
WBC: 10.6 10*3/uL — ABNORMAL HIGH (ref 4.0–10.5)
nRBC: 0 % (ref 0.0–0.2)

## 2018-06-03 LAB — BASIC METABOLIC PANEL
Anion gap: 12 (ref 5–15)
BUN: 19 mg/dL (ref 8–23)
CO2: 27 mmol/L (ref 22–32)
Calcium: 8.3 mg/dL — ABNORMAL LOW (ref 8.9–10.3)
Chloride: 97 mmol/L — ABNORMAL LOW (ref 98–111)
Creatinine, Ser: 0.97 mg/dL (ref 0.44–1.00)
GFR calc Af Amer: >60 mL/min (ref >60)
GFR calc non Af Amer: >60 mL/min (ref >60)
Glucose, Bld: 61 mg/dL — ABNORMAL LOW (ref 70–99)
Potassium: 3 mmol/L — ABNORMAL LOW (ref 3.5–5.1)
Sodium: 136 mmol/L (ref 135–145)

## 2018-06-03 LAB — VANCOMYCIN, TROUGH: Vancomycin Tr: 11 ug/mL — ABNORMAL LOW (ref 15–20)

## 2018-06-03 LAB — OCCULT BLOOD X 1 CARD TO LAB, STOOL: Fecal Occult Bld: NEGATIVE

## 2018-06-04 LAB — CBC
HCT: 23.5 % — ABNORMAL LOW (ref 36.0–46.0)
Hemoglobin: 7.4 g/dL — ABNORMAL LOW (ref 12.0–15.0)
MCH: 26.4 pg (ref 26.0–34.0)
MCHC: 31.5 g/dL (ref 30.0–36.0)
MCV: 83.9 fL (ref 80.0–100.0)
Platelets: 488 10*3/uL — ABNORMAL HIGH (ref 150–400)
RBC: 2.8 MIL/uL — ABNORMAL LOW (ref 3.87–5.11)
RDW: 17.9 % — ABNORMAL HIGH (ref 11.5–15.5)
WBC: 9.4 10*3/uL (ref 4.0–10.5)
nRBC: 0 % (ref 0.0–0.2)

## 2018-06-04 LAB — POTASSIUM: Potassium: 3.8 mmol/L (ref 3.5–5.1)

## 2018-06-05 LAB — CBC
HCT: 23.1 % — ABNORMAL LOW (ref 36.0–46.0)
Hemoglobin: 7.1 g/dL — ABNORMAL LOW (ref 12.0–15.0)
MCH: 25.8 pg — ABNORMAL LOW (ref 26.0–34.0)
MCHC: 30.7 g/dL (ref 30.0–36.0)
MCV: 84 fL (ref 80.0–100.0)
Platelets: 458 10*3/uL — ABNORMAL HIGH (ref 150–400)
RBC: 2.75 MIL/uL — ABNORMAL LOW (ref 3.87–5.11)
RDW: 17.9 % — ABNORMAL HIGH (ref 11.5–15.5)
WBC: 9.9 10*3/uL (ref 4.0–10.5)
nRBC: 0 % (ref 0.0–0.2)

## 2018-06-05 LAB — VANCOMYCIN, TROUGH: Vancomycin Tr: 16 ug/mL (ref 15–20)

## 2018-06-06 LAB — CULTURE, BLOOD (ROUTINE X 2)
Culture: NO GROWTH
Culture: NO GROWTH
Special Requests: ADEQUATE
Special Requests: ADEQUATE

## 2018-06-07 ENCOUNTER — Other Ambulatory Visit (HOSPITAL_COMMUNITY): Payer: Self-pay

## 2018-06-07 LAB — CBC
HCT: 25.6 % — ABNORMAL LOW (ref 36.0–46.0)
Hemoglobin: 8.1 g/dL — ABNORMAL LOW (ref 12.0–15.0)
MCH: 26 pg (ref 26.0–34.0)
MCHC: 31.6 g/dL (ref 30.0–36.0)
MCV: 82.1 fL (ref 80.0–100.0)
Platelets: 529 10*3/uL — ABNORMAL HIGH (ref 150–400)
RBC: 3.12 MIL/uL — ABNORMAL LOW (ref 3.87–5.11)
RDW: 17.6 % — ABNORMAL HIGH (ref 11.5–15.5)
WBC: 14 10*3/uL — ABNORMAL HIGH (ref 4.0–10.5)
nRBC: 0 % (ref 0.0–0.2)

## 2018-06-07 LAB — BASIC METABOLIC PANEL
Anion gap: 13 (ref 5–15)
BUN: 12 mg/dL (ref 8–23)
CO2: 27 mmol/L (ref 22–32)
Calcium: 8.5 mg/dL — ABNORMAL LOW (ref 8.9–10.3)
Chloride: 97 mmol/L — ABNORMAL LOW (ref 98–111)
Creatinine, Ser: 0.85 mg/dL (ref 0.44–1.00)
GFR calc Af Amer: >60 mL/min (ref >60)
GFR calc non Af Amer: >60 mL/min (ref >60)
Glucose, Bld: 90 mg/dL (ref 70–99)
Potassium: 2.7 mmol/L — CL (ref 3.5–5.1)
Sodium: 137 mmol/L (ref 135–145)

## 2018-06-08 LAB — POTASSIUM: Potassium: 3.1 mmol/L — ABNORMAL LOW (ref 3.5–5.1)

## 2018-06-09 LAB — VANCOMYCIN, TROUGH: Vancomycin Tr: 30 ug/mL (ref 15–20)

## 2018-06-09 LAB — POTASSIUM: Potassium: 3 mmol/L — ABNORMAL LOW (ref 3.5–5.1)

## 2018-06-10 LAB — POTASSIUM: Potassium: 3.3 mmol/L — ABNORMAL LOW (ref 3.5–5.1)

## 2018-06-11 ENCOUNTER — Other Ambulatory Visit (HOSPITAL_COMMUNITY): Payer: Self-pay

## 2018-06-11 LAB — CBC WITH DIFFERENTIAL/PLATELET
Abs Immature Granulocytes: 0.06 10*3/uL (ref 0.00–0.07)
Basophils Absolute: 0 10*3/uL (ref 0.0–0.1)
Basophils Relative: 0 %
Eosinophils Absolute: 0.1 10*3/uL (ref 0.0–0.5)
Eosinophils Relative: 1 %
HCT: 24.2 % — ABNORMAL LOW (ref 36.0–46.0)
Hemoglobin: 7.7 g/dL — ABNORMAL LOW (ref 12.0–15.0)
Immature Granulocytes: 1 %
Lymphocytes Relative: 20 %
Lymphs Abs: 2.2 10*3/uL (ref 0.7–4.0)
MCH: 26.2 pg (ref 26.0–34.0)
MCHC: 31.8 g/dL (ref 30.0–36.0)
MCV: 82.3 fL (ref 80.0–100.0)
Monocytes Absolute: 0.8 10*3/uL (ref 0.1–1.0)
Monocytes Relative: 7 %
Neutro Abs: 8.2 10*3/uL — ABNORMAL HIGH (ref 1.7–7.7)
Neutrophils Relative %: 71 %
Platelets: 516 10*3/uL — ABNORMAL HIGH (ref 150–400)
RBC: 2.94 MIL/uL — ABNORMAL LOW (ref 3.87–5.11)
RDW: 18.1 % — ABNORMAL HIGH (ref 11.5–15.5)
WBC: 11.4 10*3/uL — ABNORMAL HIGH (ref 4.0–10.5)
nRBC: 0 % (ref 0.0–0.2)

## 2018-06-11 LAB — BASIC METABOLIC PANEL
Anion gap: 9 (ref 5–15)
BUN: 15 mg/dL (ref 8–23)
CO2: 27 mmol/L (ref 22–32)
Calcium: 8.2 mg/dL — ABNORMAL LOW (ref 8.9–10.3)
Chloride: 103 mmol/L (ref 98–111)
Creatinine, Ser: 1.06 mg/dL — ABNORMAL HIGH (ref 0.44–1.00)
GFR calc Af Amer: >60 mL/min (ref >60)
GFR calc non Af Amer: 57 mL/min — ABNORMAL LOW (ref >60)
Glucose, Bld: 38 mg/dL — CL (ref 70–99)
Potassium: 3.3 mmol/L — ABNORMAL LOW (ref 3.5–5.1)
Sodium: 139 mmol/L (ref 135–145)

## 2018-06-11 LAB — LACTIC ACID, PLASMA: Lactic Acid, Venous: 0.6 mmol/L (ref 0.5–1.9)

## 2018-06-11 LAB — VANCOMYCIN, TROUGH: Vancomycin Tr: 18 ug/mL (ref 15–20)

## 2018-06-12 LAB — NOVEL CORONAVIRUS, NAA (HOSP ORDER, SEND-OUT TO REF LAB; TAT 18-24 HRS): SARS-CoV-2, NAA: NOT DETECTED

## 2018-06-14 LAB — CBC
HCT: 22.3 % — ABNORMAL LOW (ref 36.0–46.0)
Hemoglobin: 6.9 g/dL — CL (ref 12.0–15.0)
MCH: 25.3 pg — ABNORMAL LOW (ref 26.0–34.0)
MCHC: 30.9 g/dL (ref 30.0–36.0)
MCV: 81.7 fL (ref 80.0–100.0)
Platelets: 404 10*3/uL — ABNORMAL HIGH (ref 150–400)
RBC: 2.73 MIL/uL — ABNORMAL LOW (ref 3.87–5.11)
RDW: 18.3 % — ABNORMAL HIGH (ref 11.5–15.5)
WBC: 12.5 10*3/uL — ABNORMAL HIGH (ref 4.0–10.5)
nRBC: 0 % (ref 0.0–0.2)

## 2018-06-14 LAB — BASIC METABOLIC PANEL
Anion gap: 7 (ref 5–15)
BUN: 23 mg/dL (ref 8–23)
CO2: 25 mmol/L (ref 22–32)
Calcium: 8.1 mg/dL — ABNORMAL LOW (ref 8.9–10.3)
Chloride: 100 mmol/L (ref 98–111)
Creatinine, Ser: 1.36 mg/dL — ABNORMAL HIGH (ref 0.44–1.00)
GFR calc Af Amer: 49 mL/min — ABNORMAL LOW (ref >60)
GFR calc non Af Amer: 42 mL/min — ABNORMAL LOW (ref >60)
Glucose, Bld: 151 mg/dL — ABNORMAL HIGH (ref 70–99)
Potassium: 4.2 mmol/L (ref 3.5–5.1)
Sodium: 132 mmol/L — ABNORMAL LOW (ref 135–145)

## 2018-06-14 LAB — PREPARE RBC (CROSSMATCH)

## 2018-06-14 LAB — MAGNESIUM: Magnesium: 1.9 mg/dL (ref 1.7–2.4)

## 2018-06-15 ENCOUNTER — Other Ambulatory Visit (HOSPITAL_COMMUNITY): Payer: Self-pay

## 2018-06-15 LAB — BPAM RBC
Blood Product Expiration Date: 202006202359
ISSUE DATE / TIME: 202005241827
Unit Type and Rh: 5100

## 2018-06-15 LAB — CULTURE, BLOOD (ROUTINE X 2)
Culture: NO GROWTH
Culture: NO GROWTH
Special Requests: ADEQUATE
Special Requests: ADEQUATE

## 2018-06-15 LAB — TYPE AND SCREEN
ABO/RH(D): O POS
Antibody Screen: NEGATIVE
Unit division: 0

## 2018-06-15 LAB — CBC
HCT: 26.1 % — ABNORMAL LOW (ref 36.0–46.0)
Hemoglobin: 8.2 g/dL — ABNORMAL LOW (ref 12.0–15.0)
MCH: 25.8 pg — ABNORMAL LOW (ref 26.0–34.0)
MCHC: 31.4 g/dL (ref 30.0–36.0)
MCV: 82.1 fL (ref 80.0–100.0)
Platelets: 407 10*3/uL — ABNORMAL HIGH (ref 150–400)
RBC: 3.18 MIL/uL — ABNORMAL LOW (ref 3.87–5.11)
RDW: 17.5 % — ABNORMAL HIGH (ref 11.5–15.5)
WBC: 12.5 10*3/uL — ABNORMAL HIGH (ref 4.0–10.5)
nRBC: 0 % (ref 0.0–0.2)

## 2018-06-15 LAB — SEDIMENTATION RATE: Sed Rate: >140 mm/hr — ABNORMAL HIGH (ref 0–22)

## 2018-06-16 ENCOUNTER — Other Ambulatory Visit (HOSPITAL_COMMUNITY): Payer: Self-pay

## 2018-06-16 DIAGNOSIS — F1910 Other psychoactive substance abuse, uncomplicated: Secondary | ICD-10-CM | POA: Diagnosis not present

## 2018-06-16 DIAGNOSIS — R6520 Severe sepsis without septic shock: Secondary | ICD-10-CM

## 2018-06-16 DIAGNOSIS — J9621 Acute and chronic respiratory failure with hypoxia: Secondary | ICD-10-CM

## 2018-06-16 DIAGNOSIS — A419 Sepsis, unspecified organism: Secondary | ICD-10-CM

## 2018-06-16 DIAGNOSIS — R918 Other nonspecific abnormal finding of lung field: Secondary | ICD-10-CM

## 2018-06-16 DIAGNOSIS — D869 Sarcoidosis, unspecified: Secondary | ICD-10-CM | POA: Diagnosis not present

## 2018-06-16 LAB — CBC
HCT: 26.5 % — ABNORMAL LOW (ref 36.0–46.0)
Hemoglobin: 8.3 g/dL — ABNORMAL LOW (ref 12.0–15.0)
MCH: 25.8 pg — ABNORMAL LOW (ref 26.0–34.0)
MCHC: 31.3 g/dL (ref 30.0–36.0)
MCV: 82.3 fL (ref 80.0–100.0)
Platelets: 386 10*3/uL (ref 150–400)
RBC: 3.22 MIL/uL — ABNORMAL LOW (ref 3.87–5.11)
RDW: 17.8 % — ABNORMAL HIGH (ref 11.5–15.5)
WBC: 11.1 10*3/uL — ABNORMAL HIGH (ref 4.0–10.5)
nRBC: 0 % (ref 0.0–0.2)

## 2018-06-16 LAB — BASIC METABOLIC PANEL
Anion gap: 7 (ref 5–15)
BUN: 24 mg/dL — ABNORMAL HIGH (ref 8–23)
CO2: 26 mmol/L (ref 22–32)
Calcium: 8.3 mg/dL — ABNORMAL LOW (ref 8.9–10.3)
Chloride: 105 mmol/L (ref 98–111)
Creatinine, Ser: 1.56 mg/dL — ABNORMAL HIGH (ref 0.44–1.00)
GFR calc Af Amer: 41 mL/min — ABNORMAL LOW (ref >60)
GFR calc non Af Amer: 35 mL/min — ABNORMAL LOW (ref >60)
Glucose, Bld: 64 mg/dL — ABNORMAL LOW (ref 70–99)
Potassium: 3.9 mmol/L (ref 3.5–5.1)
Sodium: 138 mmol/L (ref 135–145)

## 2018-06-16 NOTE — Consult Note (Addendum)
Pulmonary Critical Care Medicine [facility] [place]  PULMONARY SERVICE  Date of Service: 06/16/2018  PULMONARY CRITICAL CARE CONSULT   Rachel Proctor  UVO:[id number]  DOB: 1955-07-26   DOA: 05/29/2018  Referring Physician: Carron Curie, MD  HPI: Rachel Proctor is a 63 y.o. female seen for follow up of Acute on Chronic Respiratory Failure.  Patient has a past medical history significant for sarcoidosis hyperlipidemia diabetes mellitus hypertension polysubstance abuse apparently came into the hospital because she was found to be in diabetic ketoacidosis.  Patient was also noted to have an infected amputation site.  Patient was admitted to the hospital in the intensive care unit and started on IV insulin.  Eventually her sugars did improve and she has shown some improvement as far as her wounds are concerned.  The patient has had ongoing low-grade fevers and she has had also ongoing elevation of her white count.  She did have a history of a urinary tract infection which was treated with Zosyn.  Patient also had blood cultures which were positive and that grew MRSA.  Patient was started on IV vancomycin and started on daptomycin afterwards.  Esophageal echo was ordered but the cardiology felt that patient was not a candidate.  Transthoracic echo showed no evidence of valvular vegetations.  CT scan showed that she had multiple nodular densities.  There were some areas that were cavitating also.  Transferred to our facility for further management.  We are asked to comment on the findings of the CT.  Review of Systems:  ROS performed and is unremarkable other than noted above.  Past Medical History:  Diagnosis Date  . Asthma   . Carpal tunnel syndrome   . Depression   . Diabetes mellitus without complication (HCC)   . GERD (gastroesophageal reflux disease)   . Hypertension   . Osteomyelitis of foot, left, acute (HCC)   . Sarcoidosis   . Sepsis (HCC)   . Ulnar neuropathy     Past  Surgical History:  Procedure Laterality Date  . AMPUTATION Right 05/29/2018   Procedure: AMPUTATION BELOW KNEE;  Surgeon: Nadara Mustard, MD;  Location: [facility] OR;  Service: Orthopedics;  Laterality: Right;  . amputation metatarsal with toe    . APPENDECTOMY    . APPLICATION OF WOUND VAC Right 05/29/2018   Procedure: Application Of Wound Vac;  Surgeon: Nadara Mustard, MD;  Location: [facility] OR;  Service: Orthopedics;  Laterality: Right;  . BACK SURGERY    . FOOT AMPUTATION THROUGH METATARSAL    . lengthening/shortening single tendon lower leg      Social History:    reports that she has never smoked. She has never used smokeless tobacco. She reports that she does not drink alcohol or use drugs.  Family History: Non-Contributory to the present illness  Allergies  Allergen Reactions  . Iodine Anaphylaxis  . Morphine And Related Anaphylaxis  . Shellfish Allergy Anaphylaxis  . Bee Venom Hives  . Pravastatin Other (See Comments)    Leg cramps    Medications: Reviewed on Rounds  Physical Exam:  Vitals: Temperature is 97.8 pulse 84 respiratory rate 16 blood pressure 130/88 saturations 100%  Ventilator Settings patient is currently off the ventilator on room air  . General: Comfortable at this time . Eyes: Grossly normal lids, irises & conjunctiva . ENT: grossly tongue is normal . Neck: no obvious mass . Cardiovascular: S1-S2 normal no gallop or rub is noted . Respiratory: No rhonchi no rales are noted at this time .  Abdomen: Soft and nontender . Skin: no rash seen on limited exam . Musculoskeletal: not rigid . Psychiatric:unable to assess . Neurologic: no seizure no involuntary movements         Labs on Admission:  Basic Metabolic Panel: Recent Labs  Lab 06/10/18 0530 06/11/18 0459 06/14/18 1237 06/16/18 0551  NA  --  139 132* 138  K 3.3* 3.3* 4.2 3.9  CL  --  103 100 105  CO2  --  27 25 26   GLUCOSE  --  38* 151* 64*  BUN  --  15 23 24*  CREATININE  --  1.06* 1.36* 1.56*   CALCIUM  --  8.2* 8.1* 8.3*  MG  --   --  1.9  --     No results for input(s): PHART, PCO2ART, PO2ART, HCO3, O2SAT in the last 168 hours.  Liver Function Tests: No results for input(s): AST, ALT, ALKPHOS, BILITOT, PROT, ALBUMIN in the last 168 hours. No results for input(s): LIPASE, AMYLASE in the last 168 hours. No results for input(s): AMMONIA in the last 168 hours.  CBC: Recent Labs  Lab 06/11/18 0459 06/14/18 1237 06/15/18 0649 06/16/18 0551  WBC 11.4* 12.5* 12.5* 11.1*  NEUTROABS 8.2*  --   --   --   HGB 7.7* 6.9* 8.2* 8.3*  HCT 24.2* 22.3* 26.1* 26.5*  MCV 82.3 81.7 82.1 82.3  PLT 516* 404* 407* 386    Cardiac Enzymes: No results for input(s): CKTOTAL, CKMB, CKMBINDEX, TROPONINI in the last 168 hours.  BNP (last 3 results) No results for input(s): BNP in the last 8760 hours.  ProBNP (last 3 results) No results for input(s): PROBNP in the last 8760 hours.   Radiological Exams on Admission: Ct Head Wo Contrast  Result Date: 06/16/2018 CLINICAL DATA:  Altered mental status EXAM: CT HEAD WITHOUT CONTRAST TECHNIQUE: Contiguous axial images were obtained from the base of the skull through the vertex without intravenous contrast. COMPARISON:  None. FINDINGS: Brain: There is mild diffuse atrophy. There is no intracranial mass, hemorrhage, extra-axial fluid collection, or midline shift. There is slight small vessel disease in the centra semiovale bilaterally. Elsewhere, brain parenchyma appears unremarkable. There is no demonstrable acute infarct. Vascular: There is no hyperdense vessel. There is calcification in each carotid siphon region. Skull: The bony calvarium appears intact. Sinuses/Orbits: There is mucosal thickening in several ethmoid air cells. Other visualized paranasal sinuses are clear. Orbits appear symmetric bilaterally. Other: Mastoid air cells are clear. IMPRESSION: Mild atrophy with mild periventricular small vessel disease. No evident acute infarct. No mass or  hemorrhage. There are foci of arterial vascular calcification. There is mucosal thickening in several ethmoid air cells. Electronically Signed   By: Bretta BangWilliam  Woodruff III M.D.   On: 06/16/2018 15:25   Dg Chest Port 1 View  Result Date: 06/15/2018 CLINICAL DATA:  Fever and body aches. EXAM: PORTABLE CHEST 1 VIEW COMPARISON:  06/07/2018 FINDINGS: 1253 hours. Low lung volumes. The cardio pericardial silhouette is enlarged. Patchy areas of bilateral airspace disease are similar to prior and most prominent in the left base. No pulmonary edema or substantial pleural effusion. The visualized bony structures of the thorax are intact. Telemetry leads overlie the chest. IMPRESSION: Similar appearance of the multifocal patchy opacities in both lungs. Multifocal pneumonia likely although metastatic disease could have this appearance. Electronically Signed   By: Kennith CenterEric  Mansell M.D.   On: 06/15/2018 13:37    Assessment/Plan Active Problems:   Acute on chronic respiratory failure with hypoxia (HCC)  Severe sepsis (HCC)   Sarcoidosis   Polysubstance abuse (HCC)   1. Acute on chronic respiratory failure with hypoxia patient right now is not requiring any oxygen she has had respiratory failure which is now improved.  Main issue is probably sarcoidosis which findings on the CT scan. 2. Sarcoidosis spoke with primary care team will do a repeat CT scan to get a better look at the parenchyma she still have the nodules but I did not really visualize any groundglass opacities.  If there is any groundglass opacities would consider bronchoscopy or even steroid therapy due to the underlying sarcoidosis. 3. Pulmonary nodules likely related to the sarcoid we will continue to monitor closely 4. Severe sepsis this has resolved patient had MRSA bacteremia is treated at the other facility we will continue with supportive care. 5. Polysubstance abuse monitor we will continue present management  I have personally seen and  evaluated the patient, evaluated laboratory and imaging results, formulated the assessment and plan and placed orders. The Patient requires high complexity decision making for assessment and support.  Case was discussed on Rounds with the Respiratory Therapy Staff Time Spent  Yevonne Pax, MD [facility] Pulmonary Critical Care Medicine Sleep Medicine

## 2018-06-17 ENCOUNTER — Encounter: Payer: Self-pay | Admitting: Internal Medicine

## 2018-06-17 DIAGNOSIS — F1910 Other psychoactive substance abuse, uncomplicated: Secondary | ICD-10-CM | POA: Diagnosis present

## 2018-06-17 DIAGNOSIS — A419 Sepsis, unspecified organism: Secondary | ICD-10-CM | POA: Diagnosis present

## 2018-06-17 DIAGNOSIS — D869 Sarcoidosis, unspecified: Secondary | ICD-10-CM | POA: Diagnosis present

## 2018-06-17 DIAGNOSIS — J9621 Acute and chronic respiratory failure with hypoxia: Secondary | ICD-10-CM | POA: Diagnosis present

## 2018-06-17 DIAGNOSIS — R6520 Severe sepsis without septic shock: Secondary | ICD-10-CM | POA: Diagnosis present

## 2018-06-17 LAB — BASIC METABOLIC PANEL
Anion gap: 9 (ref 5–15)
BUN: 25 mg/dL — ABNORMAL HIGH (ref 8–23)
CO2: 25 mmol/L (ref 22–32)
Calcium: 8.1 mg/dL — ABNORMAL LOW (ref 8.9–10.3)
Chloride: 104 mmol/L (ref 98–111)
Creatinine, Ser: 1.37 mg/dL — ABNORMAL HIGH (ref 0.44–1.00)
GFR calc Af Amer: 48 mL/min — ABNORMAL LOW (ref >60)
GFR calc non Af Amer: 42 mL/min — ABNORMAL LOW (ref >60)
Glucose, Bld: 107 mg/dL — ABNORMAL HIGH (ref 70–99)
Potassium: 3.8 mmol/L (ref 3.5–5.1)
Sodium: 138 mmol/L (ref 135–145)

## 2018-06-17 LAB — CBC
HCT: 25.9 % — ABNORMAL LOW (ref 36.0–46.0)
Hemoglobin: 8.1 g/dL — ABNORMAL LOW (ref 12.0–15.0)
MCH: 25.6 pg — ABNORMAL LOW (ref 26.0–34.0)
MCHC: 31.3 g/dL (ref 30.0–36.0)
MCV: 82 fL (ref 80.0–100.0)
Platelets: 394 10*3/uL (ref 150–400)
RBC: 3.16 MIL/uL — ABNORMAL LOW (ref 3.87–5.11)
RDW: 17.5 % — ABNORMAL HIGH (ref 11.5–15.5)
WBC: 11 10*3/uL — ABNORMAL HIGH (ref 4.0–10.5)
nRBC: 0 % (ref 0.0–0.2)

## 2018-06-18 ENCOUNTER — Other Ambulatory Visit (HOSPITAL_COMMUNITY): Payer: Self-pay

## 2018-06-18 DIAGNOSIS — J9621 Acute and chronic respiratory failure with hypoxia: Secondary | ICD-10-CM | POA: Diagnosis not present

## 2018-06-18 DIAGNOSIS — R918 Other nonspecific abnormal finding of lung field: Secondary | ICD-10-CM | POA: Diagnosis not present

## 2018-06-18 DIAGNOSIS — F1910 Other psychoactive substance abuse, uncomplicated: Secondary | ICD-10-CM | POA: Diagnosis not present

## 2018-06-18 DIAGNOSIS — D869 Sarcoidosis, unspecified: Secondary | ICD-10-CM | POA: Diagnosis not present

## 2018-06-18 MED ORDER — IOHEXOL 300 MG/ML  SOLN
75.0000 mL | Freq: Once | INTRAMUSCULAR | Status: AC | PRN
  Administered 2018-06-18: 04:00:00 75 mL via INTRAVENOUS

## 2018-06-18 NOTE — Progress Notes (Signed)
[facility] [place]   PULMONARY CRITICAL CARE SERVICE  PROGRESS NOTE  Date of Service: 06/18/2018  Rachel BlueJanice Kiehn  ZOX:[id number]  DOB: August 19, 1955   DOA: 05/29/2018  Referring Physician: Carron CurieAli Hijazi, MD  HPI: Rachel Proctor is a 63 y.o. female seen for follow up of Acute on Chronic Respiratory Failure.  Patient has CT scan done of the chest shows some new area of osteomyelitis in the thoracic spine.  The lungs actually look like they are improving  Medications: Reviewed on Rounds  Physical Exam:  Vitals: Temperature 98.7 pulse 85 respiratory rate 22 blood pressure 140/67 saturations 98%  Ventilator Settings off the ventilator  . General: Comfortable at this time . Eyes: Grossly normal lids, irises & conjunctiva . ENT: grossly tongue is normal . Neck: no obvious mass . Cardiovascular: S1 S2 normal no gallop . Respiratory: Coarse breath sounds no rhonchi . Abdomen: soft . Skin: no rash seen on limited exam . Musculoskeletal: not rigid . Psychiatric:unable to assess . Neurologic: no seizure no involuntary movements         Lab Data:   Basic Metabolic Panel: Recent Labs  Lab 06/14/18 1237 06/16/18 0551 06/17/18 0539  NA 132* 138 138  K 4.2 3.9 3.8  CL 100 105 104  CO2 25 26 25   GLUCOSE 151* 64* 107*  BUN 23 24* 25*  CREATININE 1.36* 1.56* 1.37*  CALCIUM 8.1* 8.3* 8.1*  MG 1.9  --   --     ABG: No results for input(s): PHART, PCO2ART, PO2ART, HCO3, O2SAT in the last 168 hours.  Liver Function Tests: No results for input(s): AST, ALT, ALKPHOS, BILITOT, PROT, ALBUMIN in the last 168 hours. No results for input(s): LIPASE, AMYLASE in the last 168 hours. No results for input(s): AMMONIA in the last 168 hours.  CBC: Recent Labs  Lab 06/14/18 1237 06/15/18 0649 06/16/18 0551 06/17/18 0539  WBC 12.5* 12.5* 11.1* 11.0*  HGB 6.9* 8.2* 8.3* 8.1*  HCT 22.3* 26.1* 26.5* 25.9*  MCV 81.7 82.1 82.3 82.0  PLT 404* 407* 386  394    Cardiac Enzymes: No results for input(s): CKTOTAL, CKMB, CKMBINDEX, TROPONINI in the last 168 hours.  BNP (last 3 results) No results for input(s): BNP in the last 8760 hours.  ProBNP (last 3 results) No results for input(s): PROBNP in the last 8760 hours.  Radiological Exams: Ct Head Wo Contrast  Result Date: 06/16/2018 CLINICAL DATA:  Altered mental status EXAM: CT HEAD WITHOUT CONTRAST TECHNIQUE: Contiguous axial images were obtained from the base of the skull through the vertex without intravenous contrast. COMPARISON:  None. FINDINGS: Brain: There is mild diffuse atrophy. There is no intracranial mass, hemorrhage, extra-axial fluid collection, or midline shift. There is slight small vessel disease in the centra semiovale bilaterally. Elsewhere, brain parenchyma appears unremarkable. There is no demonstrable acute infarct. Vascular: There is no hyperdense vessel. There is calcification in each carotid siphon region. Skull: The bony calvarium appears intact. Sinuses/Orbits: There is mucosal thickening in several ethmoid air cells. Other visualized paranasal sinuses are clear. Orbits appear symmetric bilaterally. Other: Mastoid air cells are clear. IMPRESSION: Mild atrophy with mild periventricular small vessel disease. No evident acute infarct. No mass or hemorrhage. There are foci of arterial vascular calcification. There is mucosal thickening in several ethmoid air cells. Electronically Signed   By: Bretta BangWilliam  Woodruff III M.D.   On: 06/16/2018 15:25   Ct Chest W Contrast  Result Date: 06/18/2018 CLINICAL DATA:  Sarcoidosis severe sepsis.  Polysubstance abuse. EXAM: CT CHEST WITH CONTRAST TECHNIQUE: Multidetector CT imaging of the chest was performed during intravenous contrast administration. CONTRAST:  66mL OMNIPAQUE IOHEXOL 300 MG/ML  SOLN COMPARISON:  05/26/2018 FINDINGS: Cardiovascular: Normal heart size. Stable small pericardial effusion that is low-density. Atherosclerotic  calcification. No acute vascular finding. Mediastinum/Nodes: Negative for adenopathy or mass. Lungs/Pleura: Scattered nodular opacities with cavitation have mildly improved in size, and cavitation is no longer seen. There are stable small pleural effusions with segmental atelectasis in the lower lobes. No air leak. No pulmonary edema. Upper Abdomen: Contracted gallbladder. Musculoskeletal: T6-7 endplate erosion, suspicious for osteomyelitis in this setting. IMPRESSION: 1. Mild improvement of pulmonary nodules with resolved cavitation. Given history of polysubstance abuse and sepsis, septic emboli is favored. 2. T6-7 endplate erosion, suspicious for osteomyelitis in this setting. Suggest thoracic MRI. 3. Small pleural effusions with atelectasis. Electronically Signed   By: Marnee Spring M.D.   On: 06/18/2018 04:06    Assessment/Plan Active Problems:   Acute on chronic respiratory failure with hypoxia (HCC)   Severe sepsis (HCC)   Sarcoidosis   Polysubstance abuse (HCC)   1. Acute on chronic respiratory failure hypoxia we will continue with oxygen as necessary currently on room air 2. Severe sepsis patient might have a focus of infection of the thoracic spine continue with supportive care. 3. Sarcoidosis stable we will monitor 4. Polysubstance abuse she is at baseline   I have personally seen and evaluated the patient, evaluated laboratory and imaging results, formulated the assessment and plan and placed orders. The Patient requires high complexity decision making for assessment and support.  Case was discussed on Rounds with the Respiratory Therapy Staff  Yevonne Pax, MD [facility] Pulmonary Critical Care Medicine Sleep Medicine

## 2018-06-30 ENCOUNTER — Other Ambulatory Visit: Payer: Self-pay

## 2018-06-30 ENCOUNTER — Telehealth: Payer: Self-pay

## 2018-06-30 DIAGNOSIS — Z89511 Acquired absence of right leg below knee: Secondary | ICD-10-CM

## 2018-06-30 NOTE — Telephone Encounter (Signed)
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

## 2018-06-30 NOTE — Telephone Encounter (Signed)
I called and Erica at [facility]. Advised ok to follow up closer to facility. She requested referral to [facility] orthopedic and this has been entered in the chart. Pt will be at the facility for at least another month. Can you please send referral no specific doctor requested just needs management following BKA  Has not had post op care here.

## 2018-06-30 NOTE — Telephone Encounter (Signed)
Erica with [facility] at [facility] in [place], would like to know if patient needs to F/U with Dr. Sharol Given or find another orthopedic provider in [facility].  Patient had surgery 05/29/2018.  Stated that patient is at the skilled facility for Rehabilitation and IV antibiotics.  Cb# is [phone].  Please advise.  Thank you.

## 2018-06-30 NOTE — Telephone Encounter (Signed)
If patient is going to stay in the [place] area then getting her set up with a local orthopedic doctor would be ideal.

## 2018-07-01 NOTE — Telephone Encounter (Signed)
noted 

## 2019-08-31 IMAGING — CT CT CHEST WITH CONTRAST
2 of 3 series · 15 of 36 positions shown, 18 images · IV contrast (Omni 300)
Comparison: 05/26/2018

CLINICAL DATA: Sarcoidosis severe sepsis.  Polysubstance abuse.

EXAM:
CT CHEST WITH CONTRAST
TECHNIQUE: Multidetector CT imaging of the chest was performed during
intravenous contrast administration.
CONTRAST:  75mL OMNIPAQUE IOHEXOL 300 MG/ML  SOLN

[Series 3: chest with 2mm st · axial · 0.88mm/px · z∈[+1186,+1384]mm · 12 of 117 slices shown, 15 images]
[im 9/117  mediastinal]
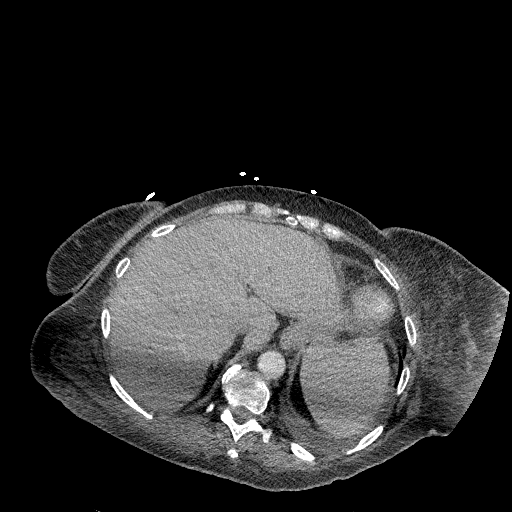
[im 9/117  lung]
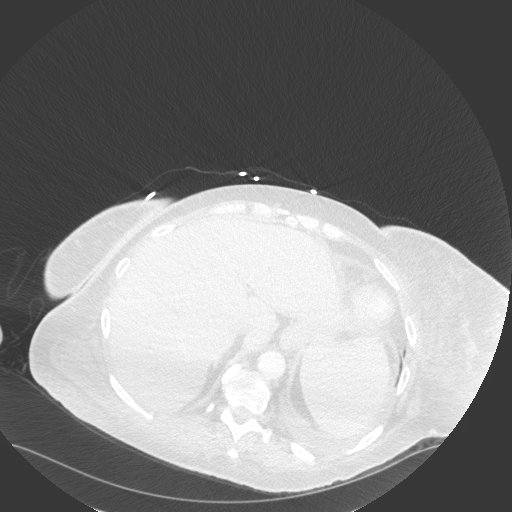
[im 18/117  lung]
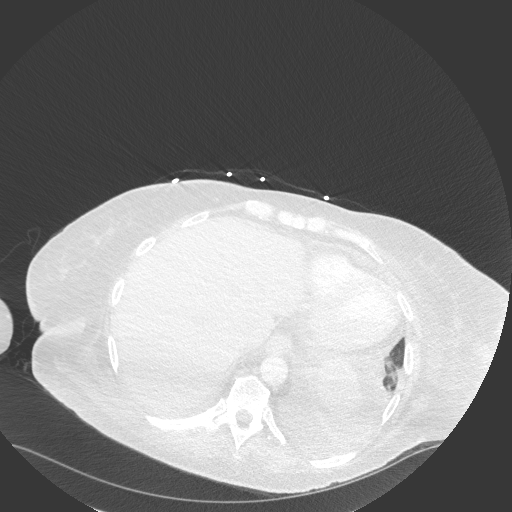
[im 26/117  lung]
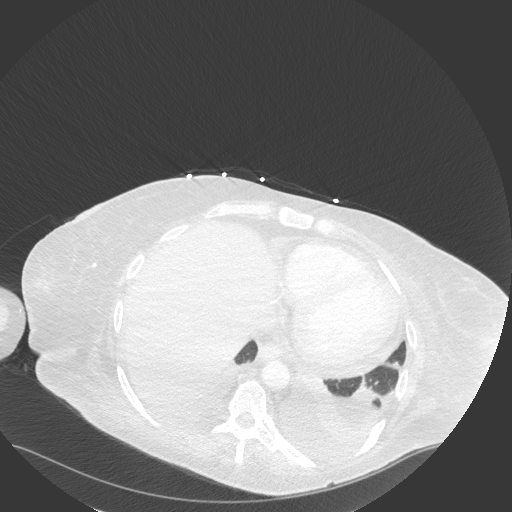
[im 35/117  lung]
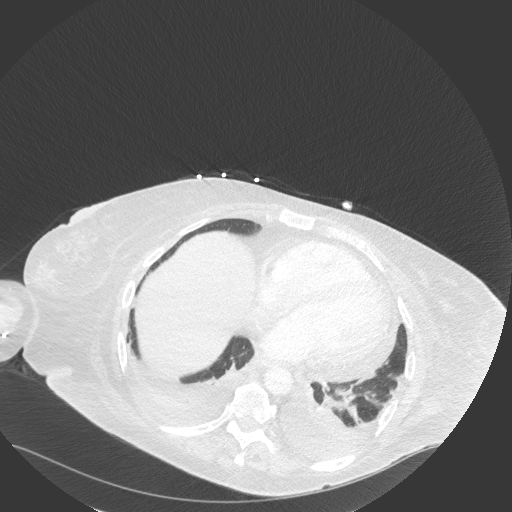
[im 43/117  mediastinal]
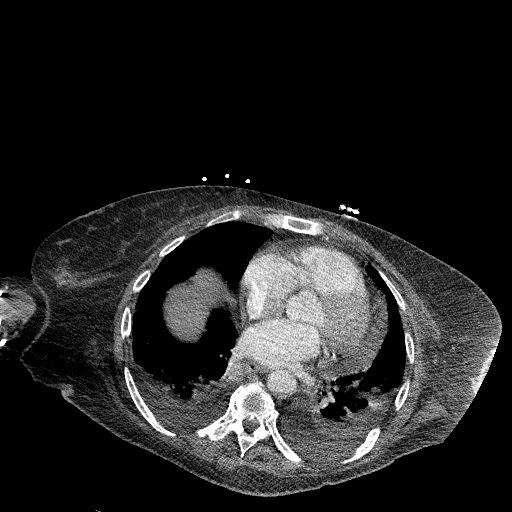
[im 43/117  lung]
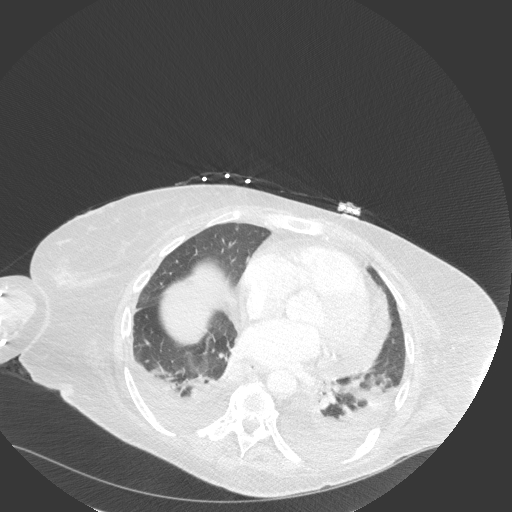
[im 52/117  lung]
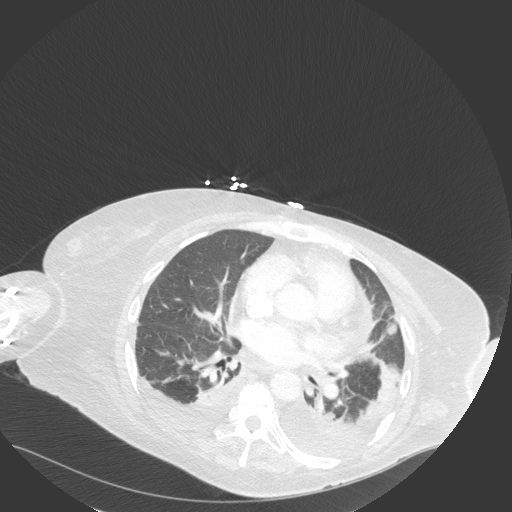
[im 65/117  lung]
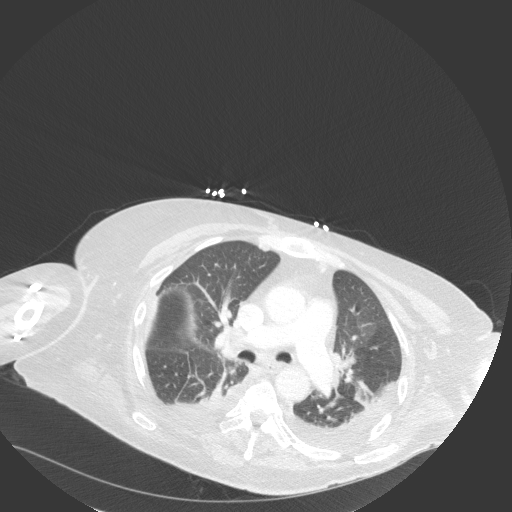
[im 74/117  lung]
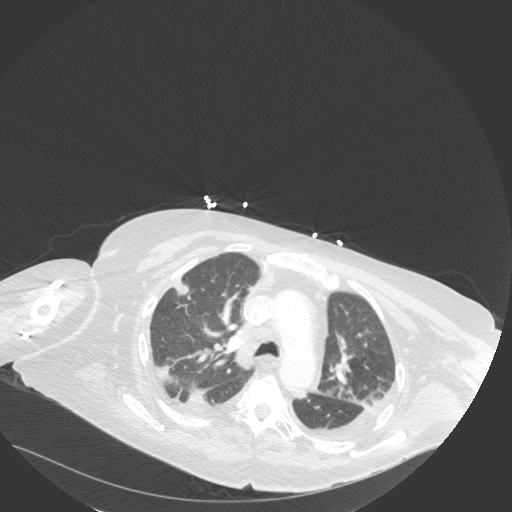
[im 82/117  mediastinal]
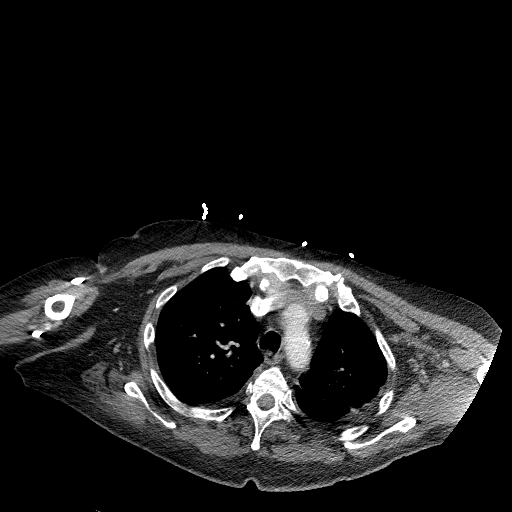
[im 82/117  lung]
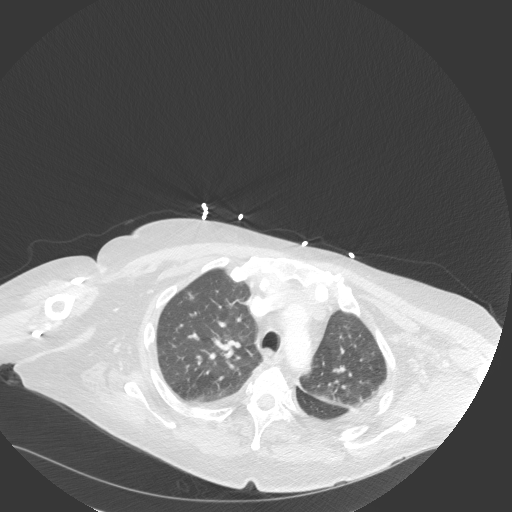
[im 91/117  lung]
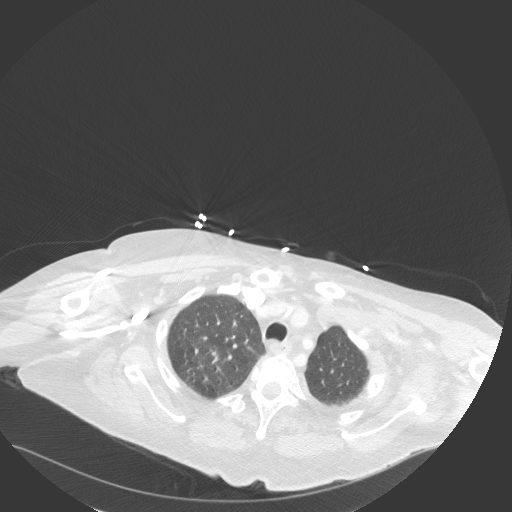
[im 99/117  lung]
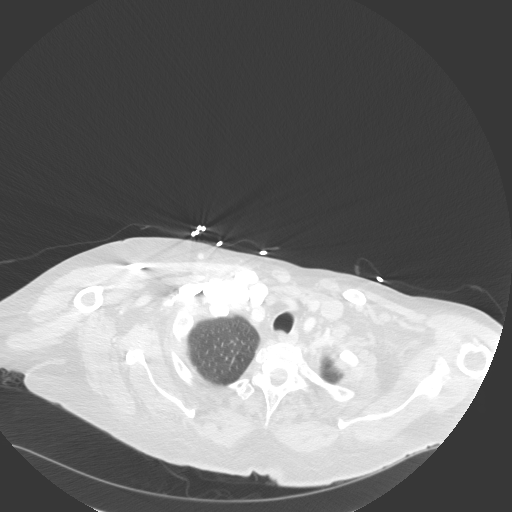
[im 108/117  lung]
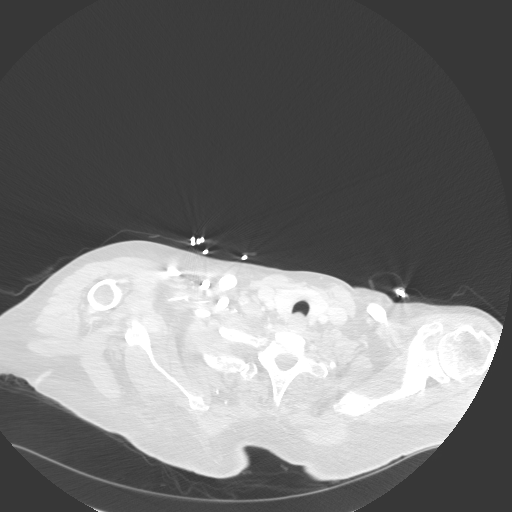

[Series 6: chest with 3mm st cor · coronal · 0.45mm/px · 3 of 82 slices shown]
[im 17/82  lung]
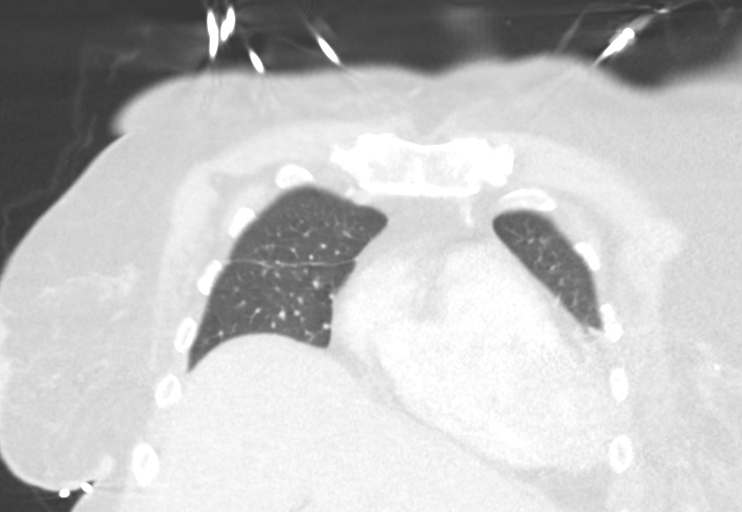
[im 33/82  lung]
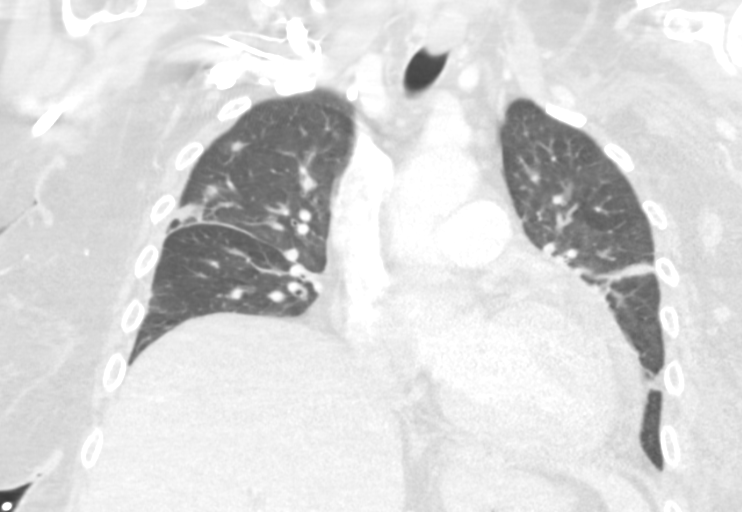
[im 49/82  lung]
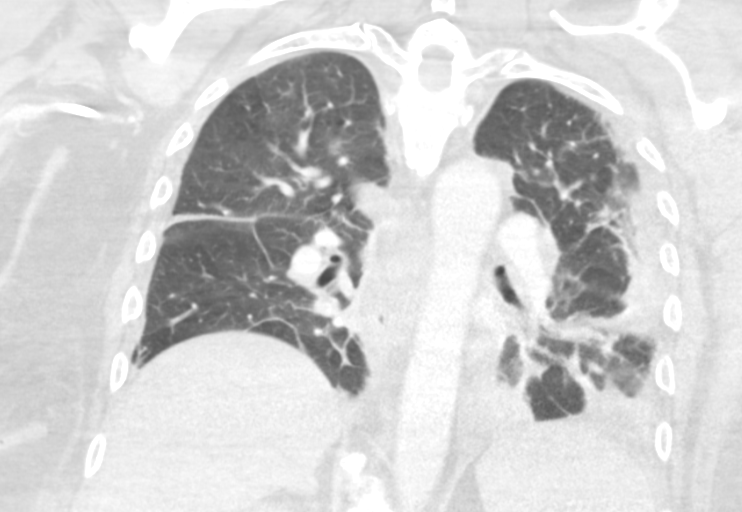

[15 of 36 positions shown; findings below may reference images not displayed]

FINDINGS: Cardiovascular: Normal heart size. Stable small pericardial effusion
that is low-density. Atherosclerotic calcification. No acute
vascular finding.

Mediastinum/Nodes: Negative for adenopathy or mass.

Lungs/Pleura: Scattered nodular opacities with cavitation have
mildly improved in size, and cavitation is no longer seen. There are
stable small pleural effusions with segmental atelectasis in the
lower lobes. No air leak. No pulmonary edema.

Upper Abdomen: Contracted gallbladder.

Musculoskeletal: T6-7 endplate erosion, suspicious for osteomyelitis
in this setting.
IMPRESSION: 1. Mild improvement of pulmonary nodules with resolved cavitation.
Given history of polysubstance abuse and sepsis, septic emboli is
favored.
2. T6-7 endplate erosion, suspicious for osteomyelitis in this
setting. Suggest thoracic MRI.
3. Small pleural effusions with atelectasis.
